# Patient Record
Sex: Male | Born: 1965 | Race: Black or African American | Hispanic: No | Marital: Single | Smoking: Never smoker
Health system: Southern US, Community
[De-identification: ages and names within clinical notes are randomized; demographics above are authoritative.]

## PROBLEM LIST (undated history)

## (undated) DIAGNOSIS — F32A Depression, unspecified: Secondary | ICD-10-CM

## (undated) DIAGNOSIS — F410 Panic disorder [episodic paroxysmal anxiety] without agoraphobia: Secondary | ICD-10-CM

## (undated) DIAGNOSIS — F329 Major depressive disorder, single episode, unspecified: Secondary | ICD-10-CM

## (undated) DIAGNOSIS — F209 Schizophrenia, unspecified: Secondary | ICD-10-CM

## (undated) DIAGNOSIS — F419 Anxiety disorder, unspecified: Secondary | ICD-10-CM

---

## 1998-12-11 ENCOUNTER — Emergency Department (HOSPITAL_COMMUNITY): Admission: EM | Admit: 1998-12-11 | Discharge: 1998-12-11 | Payer: Self-pay | Admitting: *Deleted

## 1998-12-11 ENCOUNTER — Encounter: Payer: Self-pay | Admitting: *Deleted

## 2007-02-26 ENCOUNTER — Ambulatory Visit: Payer: Self-pay | Admitting: Internal Medicine

## 2015-09-17 ENCOUNTER — Inpatient Hospital Stay (HOSPITAL_COMMUNITY)
Admission: EM | Admit: 2015-09-17 | Discharge: 2015-09-19 | DRG: 641 | Disposition: A | Discharge status: Home / Self Care | Payer: Self-pay | Attending: Internal Medicine | Admitting: Internal Medicine

## 2015-09-17 ENCOUNTER — Emergency Department (HOSPITAL_COMMUNITY): Payer: Self-pay

## 2015-09-17 ENCOUNTER — Encounter (HOSPITAL_COMMUNITY): Payer: Self-pay | Admitting: Emergency Medicine

## 2015-09-17 ENCOUNTER — Emergency Department (HOSPITAL_COMMUNITY): Admission: EM | Admit: 2015-09-17 | Discharge: 2015-09-17 | Payer: Self-pay

## 2015-09-17 DIAGNOSIS — Z79899 Other long term (current) drug therapy: Secondary | ICD-10-CM

## 2015-09-17 DIAGNOSIS — F329 Major depressive disorder, single episode, unspecified: Secondary | ICD-10-CM | POA: Diagnosis present

## 2015-09-17 DIAGNOSIS — R631 Polydipsia: Secondary | ICD-10-CM | POA: Diagnosis present

## 2015-09-17 DIAGNOSIS — E871 Hypo-osmolality and hyponatremia: Principal | ICD-10-CM

## 2015-09-17 DIAGNOSIS — F410 Panic disorder [episodic paroxysmal anxiety] without agoraphobia: Secondary | ICD-10-CM | POA: Diagnosis present

## 2015-09-17 DIAGNOSIS — R55 Syncope and collapse: Secondary | ICD-10-CM | POA: Diagnosis present

## 2015-09-17 DIAGNOSIS — R739 Hyperglycemia, unspecified: Secondary | ICD-10-CM | POA: Diagnosis present

## 2015-09-17 DIAGNOSIS — F411 Generalized anxiety disorder: Secondary | ICD-10-CM | POA: Diagnosis present

## 2015-09-17 DIAGNOSIS — F203 Undifferentiated schizophrenia: Secondary | ICD-10-CM | POA: Diagnosis present

## 2015-09-17 HISTORY — DX: Depression, unspecified: F32.A

## 2015-09-17 HISTORY — DX: Major depressive disorder, single episode, unspecified: F32.9

## 2015-09-17 HISTORY — DX: Panic disorder (episodic paroxysmal anxiety): F41.0

## 2015-09-17 HISTORY — DX: Anxiety disorder, unspecified: F41.9

## 2015-09-17 LAB — URINALYSIS, ROUTINE W REFLEX MICROSCOPIC
Bilirubin Urine: NEGATIVE
Glucose, UA: 100 mg/dL — AB
Ketones, ur: 15 mg/dL — AB
LEUKOCYTES UA: NEGATIVE
NITRITE: NEGATIVE
PROTEIN: 30 mg/dL — AB
SPECIFIC GRAVITY, URINE: 1.008 (ref 1.005–1.030)
UROBILINOGEN UA: 0.2 mg/dL (ref 0.0–1.0)
pH: 5.5 (ref 5.0–8.0)

## 2015-09-17 LAB — I-STAT VENOUS BLOOD GAS, ED
ACID-BASE DEFICIT: 11 mmol/L — AB (ref 0.0–2.0)
Acid-base deficit: 7 mmol/L — ABNORMAL HIGH (ref 0.0–2.0)
BICARBONATE: 17.3 meq/L — AB (ref 20.0–24.0)
Bicarbonate: 14.9 mEq/L — ABNORMAL LOW (ref 20.0–24.0)
O2 SAT: 98 %
O2 Saturation: 74 %
PCO2 VEN: 32.5 mmHg — AB (ref 45.0–50.0)
PCO2 VEN: 32.5 mmHg — AB (ref 45.0–50.0)
PH VEN: 7.333 — AB (ref 7.250–7.300)
TCO2: 16 mmol/L (ref 0–100)
TCO2: 18 mmol/L (ref 0–100)
pH, Ven: 7.269 (ref 7.250–7.300)
pO2, Ven: 119 mmHg — ABNORMAL HIGH (ref 30.0–45.0)
pO2, Ven: 42 mmHg (ref 30.0–45.0)

## 2015-09-17 LAB — BASIC METABOLIC PANEL
ANION GAP: 20 — AB (ref 5–15)
BUN: <5 mg/dL — ABNORMAL LOW (ref 6–20)
CALCIUM: 9.5 mg/dL (ref 8.9–10.3)
CO2: 15 mmol/L — ABNORMAL LOW (ref 22–32)
Chloride: 84 mmol/L — ABNORMAL LOW (ref 101–111)
Creatinine, Ser: 1.34 mg/dL — ABNORMAL HIGH (ref 0.61–1.24)
Glucose, Bld: 227 mg/dL — ABNORMAL HIGH (ref 65–99)
POTASSIUM: 3.7 mmol/L (ref 3.5–5.1)
Sodium: 119 mmol/L — CL (ref 135–145)

## 2015-09-17 LAB — CBC
HEMATOCRIT: 40.3 % (ref 39.0–52.0)
HEMOGLOBIN: 14.3 g/dL (ref 13.0–17.0)
MCH: 30.2 pg (ref 26.0–34.0)
MCHC: 35.5 g/dL (ref 30.0–36.0)
MCV: 85.2 fL (ref 78.0–100.0)
Platelets: 254 10*3/uL (ref 150–400)
RBC: 4.73 MIL/uL (ref 4.22–5.81)
RDW: 12.4 % (ref 11.5–15.5)
WBC: 8.9 10*3/uL (ref 4.0–10.5)

## 2015-09-17 LAB — HEPATIC FUNCTION PANEL
ALK PHOS: 67 U/L (ref 38–126)
ALT: 18 U/L (ref 17–63)
AST: 42 U/L — ABNORMAL HIGH (ref 15–41)
Albumin: 4.6 g/dL (ref 3.5–5.0)
BILIRUBIN INDIRECT: 0.7 mg/dL (ref 0.3–0.9)
Bilirubin, Direct: 0.2 mg/dL (ref 0.1–0.5)
TOTAL PROTEIN: 8 g/dL (ref 6.5–8.1)
Total Bilirubin: 0.9 mg/dL (ref 0.3–1.2)

## 2015-09-17 LAB — I-STAT TROPONIN, ED: TROPONIN I, POC: 0.01 ng/mL (ref 0.00–0.08)

## 2015-09-17 LAB — CREATININE, URINE, RANDOM: Creatinine, Urine: 49.84 mg/dL

## 2015-09-17 LAB — RAPID URINE DRUG SCREEN, HOSP PERFORMED
Amphetamines: NOT DETECTED
BARBITURATES: NOT DETECTED
Benzodiazepines: NOT DETECTED
COCAINE: NOT DETECTED
Opiates: NOT DETECTED
Tetrahydrocannabinol: NOT DETECTED

## 2015-09-17 LAB — NA AND K (SODIUM & POTASSIUM), RAND UR
POTASSIUM UR: 11 mmol/L
SODIUM UR: 47 mmol/L

## 2015-09-17 LAB — SALICYLATE LEVEL: Salicylate Lvl: <4 mg/dL (ref 2.8–30.0)

## 2015-09-17 LAB — URINE MICROSCOPIC-ADD ON

## 2015-09-17 LAB — CBG MONITORING, ED: GLUCOSE-CAPILLARY: 178 mg/dL — AB (ref 65–99)

## 2015-09-17 LAB — ETHANOL: Alcohol, Ethyl (B): <5 mg/dL (ref <5)

## 2015-09-17 LAB — LIPASE, BLOOD: LIPASE: 31 U/L (ref 22–51)

## 2015-09-17 LAB — ACETAMINOPHEN LEVEL: Acetaminophen (Tylenol), Serum: <10 ug/mL — ABNORMAL LOW (ref 10–30)

## 2015-09-17 LAB — I-STAT CG4 LACTIC ACID, ED: LACTIC ACID, VENOUS: 3.93 mmol/L — AB (ref 0.5–2.0)

## 2015-09-17 LAB — BETA-HYDROXYBUTYRIC ACID: BETA-HYDROXYBUTYRIC ACID: 0.35 mmol/L — AB (ref 0.05–0.27)

## 2015-09-17 MED ORDER — SODIUM CHLORIDE 0.9 % IV BOLUS (SEPSIS)
1000.0000 mL | Freq: Once | INTRAVENOUS | Status: AC
Start: 2015-09-17 — End: 2015-09-17
  Administered 2015-09-17: 1000 mL via INTRAVENOUS

## 2015-09-17 MED ORDER — LORAZEPAM 1 MG PO TABS
0.5000 mg | ORAL_TABLET | Freq: Once | ORAL | Status: AC
  Administered 2015-09-17: 0.5 mg via ORAL
  Filled 2015-09-17: qty 1

## 2015-09-17 MED ORDER — SODIUM CHLORIDE 0.9 % IV BOLUS (SEPSIS)
1000.0000 mL | Freq: Once | INTRAVENOUS | Status: AC
  Administered 2015-09-17: 1000 mL via INTRAVENOUS

## 2015-09-17 NOTE — ED Provider Notes (Signed)
CSN: [id number]     Arrival date & time 09/17/15  1917 History   First MD Initiated Contact with Patient 09/17/15 1941     Chief Complaint  Patient presents with  . Loss of Consciousness  . Anxiety     (Consider location/radiation/quality/duration/timing/severity/associated sxs/prior Treatment) HPI   Patient is a 49 year old male with history of anxiety and panic attacks. He is on Haldol extended release month monthly. He presents today because he was having an anxiety attack. He states that he felt like he couldn't talk at all. Patient's mom gave him a hug and let him to her bedroom and then he lowered himself down and refused to talk.  On arrival here he is tachycardic and respiratory rate is about 30. However he is lying comfortably with his eyes closed. When asked anxious he says yes. Patient has strange affect.    Past Medical History  Diagnosis Date  . Anxiety   . Panic attacks   . Depression    History reviewed. No pertinent past surgical history. History reviewed. No pertinent family history. Social History  Substance Use Topics  . Smoking status: Never Smoker   . Smokeless tobacco: None  . Alcohol Use: No    Review of Systems  Constitutional: Negative for fever and activity change.  HENT: Negative for drooling and hearing loss.   Eyes: Negative for discharge and redness.  Respiratory: Positive for shortness of breath. Negative for cough.   Cardiovascular: Negative for chest pain.  Gastrointestinal: Negative for abdominal pain.  Genitourinary: Negative for dysuria and urgency.  Musculoskeletal: Negative for arthralgias.  Allergic/Immunologic: Negative for immunocompromised state.  Neurological: Negative for seizures and speech difficulty.  Psychiatric/Behavioral: Positive for behavioral problems. Negative for agitation. The patient is nervous/anxious.   All other systems reviewed and are negative.     Allergies  Review of patient's allergies indicates no  known allergies.  Home Medications   Prior to Admission medications   Not on File   BP 160/77 mmHg  Pulse 118  Temp(Src) 99.1 F (37.3 C) (Oral)  Resp 32  Ht  (1.676 m)  Wt 191 lb (86.637 kg)  BMI 30.84 kg/m2  SpO2 98% Physical Exam  Constitutional: He is oriented to person, place, and time. He appears well-nourished.  Lying comfortably following commands and discussing things but with his eyes closed. Patient is very tachypnea.  HENT:  Head: Normocephalic.  Mouth/Throat: Oropharynx is clear and moist.  Eyes: Conjunctivae are normal.  Neck: No tracheal deviation present.  Cardiovascular:  Tachycardic  Pulmonary/Chest: Effort normal. No stridor. No respiratory distress. He has no wheezes. He has no rales.  Abdominal: Soft. There is no tenderness. There is no guarding.  Musculoskeletal: Normal range of motion. He exhibits no edema.  Neurological: He is oriented to person, place, and time. No cranial nerve deficit.  Skin: Skin is warm and dry. No rash noted. He is not diaphoretic.  Psychiatric: He has a normal mood and affect. His behavior is normal.  Nursing note and vitals reviewed.   ED Course  Procedures (including critical care time) Labs Review Labs Reviewed  BASIC METABOLIC PANEL  CBC  URINALYSIS, ROUTINE W REFLEX MICROSCOPIC (NOT AT [facility])  HEPATIC FUNCTION PANEL  LIPASE, BLOOD  ETHANOL  URINE RAPID DRUG SCREEN, HOSP PERFORMED  ACETAMINOPHEN LEVEL  SALICYLATE LEVEL  I-STAT TROPOININ, ED  CBG MONITORING, ED    Imaging Review No results found. I have personally reviewed and evaluated these images and lab results as part of  my medical decision-making.   EKG Interpretation   Date/Time:  Friday September 17 2015 19:24:09 EDT Ventricular Rate:  125 PR Interval:  118 QRS Duration: 96 QT Interval:  445 QTC Calculation: 642 R Axis:   49 Text Interpretation:  Sinus tachycardia Probable left atrial enlargement  RSR' in V1 or V2, probably normal variant  Prolonged QT interval large Ts  Sinus tachycardia Confirmed by Kandis Mannan ([id number]) on 09/17/2015  7:31:01 PM      MDM   Final diagnoses:  None   patient is a 49 year old male who lives with his mother is in today with anxiety attack. Patient's mom says that he was feeling anxious and he admits that he was feeling anxious she gave him a hug and then he could not speak for a period of time. Patient is on long-acting Haldol home. We will give him a little bit of Ativan here. He denies any ingestions. However I'm concerned about aspirin or salicylate, we will get levels now. We will get baseline labs. Patient exhibited tachypnea and tachycardia out of proportion to anxiety that he appears to be displaying.  Adelma Bowdoin Randall An, MD 09/17/15 2010

## 2015-09-17 NOTE — ED Notes (Signed)
MD at bedside. 

## 2015-09-17 NOTE — ED Notes (Signed)
Patient comes from home following syncopal episode.  Patient's mother states she heard patient crying and having an anxiety attack and held patient up while he lost consciousness.  Patient states this has happened before - hx depression/crying episodes, panic attacks, anxiety - patient reports he was thinking about something and became upset and had a panic attack.  Upon EMS arrival, pt was diaphoretic, HR 131, BP 155/98, 98% RA, RR 20.  Patient denies hitting head - mother helped him to floor and patient reports he remembers everything that happened.  Patient A&O x 4, NAD at this time.

## 2015-09-18 DIAGNOSIS — F411 Generalized anxiety disorder: Secondary | ICD-10-CM | POA: Diagnosis present

## 2015-09-18 DIAGNOSIS — R55 Syncope and collapse: Secondary | ICD-10-CM | POA: Diagnosis present

## 2015-09-18 DIAGNOSIS — F203 Undifferentiated schizophrenia: Secondary | ICD-10-CM | POA: Diagnosis present

## 2015-09-18 DIAGNOSIS — R739 Hyperglycemia, unspecified: Secondary | ICD-10-CM | POA: Diagnosis present

## 2015-09-18 LAB — BASIC METABOLIC PANEL
Anion gap: 8 (ref 5–15)
CALCIUM: 8.8 mg/dL — AB (ref 8.9–10.3)
CO2: 19 mmol/L — ABNORMAL LOW (ref 22–32)
CREATININE: 0.99 mg/dL (ref 0.61–1.24)
Chloride: 101 mmol/L (ref 101–111)
GFR calc Af Amer: >60 mL/min (ref >60)
Glucose, Bld: 112 mg/dL — ABNORMAL HIGH (ref 65–99)
Potassium: 4.2 mmol/L (ref 3.5–5.1)
SODIUM: 128 mmol/L — AB (ref 135–145)

## 2015-09-18 LAB — CBC
HCT: 36.5 % — ABNORMAL LOW (ref 39.0–52.0)
Hemoglobin: 12.9 g/dL — ABNORMAL LOW (ref 13.0–17.0)
MCH: 30.1 pg (ref 26.0–34.0)
MCHC: 35.3 g/dL (ref 30.0–36.0)
MCV: 85.1 fL (ref 78.0–100.0)
PLATELETS: 231 10*3/uL (ref 150–400)
RBC: 4.29 MIL/uL (ref 4.22–5.81)
RDW: 13.1 % (ref 11.5–15.5)
WBC: 7.6 10*3/uL (ref 4.0–10.5)

## 2015-09-18 LAB — NA AND K (SODIUM & POTASSIUM), RAND UR
POTASSIUM UR: 11 mmol/L
SODIUM UR: 48 mmol/L

## 2015-09-18 LAB — OSMOLALITY, URINE: Osmolality, Ur: 217 mOsm/kg — ABNORMAL LOW (ref 390–1090)

## 2015-09-18 LAB — GLUCOSE, CAPILLARY
GLUCOSE-CAPILLARY: 101 mg/dL — AB (ref 65–99)
GLUCOSE-CAPILLARY: 98 mg/dL (ref 65–99)
Glucose-Capillary: 112 mg/dL — ABNORMAL HIGH (ref 65–99)
Glucose-Capillary: 105 mg/dL — ABNORMAL HIGH (ref 65–99)

## 2015-09-18 LAB — TSH: TSH: 0.548 u[IU]/mL (ref 0.350–4.500)

## 2015-09-18 MED ORDER — LORAZEPAM 2 MG/ML IJ SOLN
1.0000 mg | Freq: Once | INTRAMUSCULAR | Status: AC
  Administered 2015-09-18: 1 mg via INTRAVENOUS
  Filled 2015-09-18: qty 1

## 2015-09-18 MED ORDER — SODIUM CHLORIDE 0.9 % IJ SOLN: 3.0000 mL | INTRAMUSCULAR | Status: DC | PRN

## 2015-09-18 MED ORDER — ACETAMINOPHEN 650 MG RE SUPP: 650.0000 mg | Freq: Four times a day (QID) | RECTAL | Status: DC | PRN

## 2015-09-18 MED ORDER — BENZTROPINE MESYLATE 1 MG PO TABS
0.5000 mg | ORAL_TABLET | Freq: Two times a day (BID) | ORAL | Status: DC
  Administered 2015-09-18 – 2015-09-19 (×2): 0.5 mg via ORAL
  Filled 2015-09-18 (×2): qty 1

## 2015-09-18 MED ORDER — SODIUM CHLORIDE 0.9 % IJ SOLN
3.0000 mL | Freq: Two times a day (BID) | INTRAMUSCULAR | Status: DC
  Administered 2015-09-18 – 2015-09-19 (×4): 3 mL via INTRAVENOUS

## 2015-09-18 MED ORDER — ALUM & MAG HYDROXIDE-SIMETH 200-200-20 MG/5ML PO SUSP: 30.0000 mL | Freq: Four times a day (QID) | ORAL | Status: DC | PRN

## 2015-09-18 MED ORDER — INFLUENZA VAC SPLIT QUAD 0.5 ML IM SUSY
0.5000 mL | PREFILLED_SYRINGE | INTRAMUSCULAR | Status: AC
  Administered 2015-09-19: 0.5 mL via INTRAMUSCULAR
  Filled 2015-09-18: qty 0.5

## 2015-09-18 MED ORDER — BENZTROPINE MESYLATE 1 MG PO TABS: 0.5000 mg | ORAL_TABLET | Freq: Two times a day (BID) | ORAL | Status: DC

## 2015-09-18 MED ORDER — BENZTROPINE MESYLATE 1 MG PO TABS
1.0000 mg | ORAL_TABLET | Freq: Every day | ORAL | Status: DC
  Administered 2015-09-18: 1 mg via ORAL
  Filled 2015-09-18: qty 1

## 2015-09-18 MED ORDER — ACETAMINOPHEN 325 MG PO TABS: 650.0000 mg | ORAL_TABLET | Freq: Four times a day (QID) | ORAL | Status: DC | PRN

## 2015-09-18 MED ORDER — ONDANSETRON HCL 4 MG/2ML IJ SOLN: 4.0000 mg | Freq: Four times a day (QID) | INTRAMUSCULAR | Status: DC | PRN

## 2015-09-18 MED ORDER — ONDANSETRON HCL 4 MG PO TABS: 4.0000 mg | ORAL_TABLET | Freq: Four times a day (QID) | ORAL | Status: DC | PRN

## 2015-09-18 MED ORDER — CLONAZEPAM 0.5 MG PO TABS
0.5000 mg | ORAL_TABLET | Freq: Two times a day (BID) | ORAL | Status: DC | PRN
  Administered 2015-09-19: 0.5 mg via ORAL
  Filled 2015-09-18: qty 1

## 2015-09-18 MED ORDER — SODIUM CHLORIDE 0.9 % IV SOLN: 250.0000 mL | INTRAVENOUS | Status: DC | PRN

## 2015-09-18 MED ORDER — HYDROMORPHONE HCL 1 MG/ML IJ SOLN: 0.5000 mg | INTRAMUSCULAR | Status: DC | PRN

## 2015-09-18 MED ORDER — OXYCODONE HCL 5 MG PO TABS: 5.0000 mg | ORAL_TABLET | ORAL | Status: DC | PRN

## 2015-09-18 MED ORDER — INSULIN ASPART 100 UNIT/ML ~~LOC~~ SOLN: 0.0000 [IU] | SUBCUTANEOUS | Status: DC

## 2015-09-18 MED ORDER — LORAZEPAM 2 MG/ML IJ SOLN: 0.5000 mg | Freq: Four times a day (QID) | INTRAMUSCULAR | Status: DC | PRN

## 2015-09-18 MED ORDER — BENZTROPINE MESYLATE 1 MG PO TABS
0.5000 mg | ORAL_TABLET | Freq: Every day | ORAL | Status: DC
  Administered 2015-09-18: 5 mg via ORAL
  Filled 2015-09-18: qty 1

## 2015-09-18 MED ORDER — ENOXAPARIN SODIUM 40 MG/0.4ML ~~LOC~~ SOLN
40.0000 mg | Freq: Every day | SUBCUTANEOUS | Status: DC
  Administered 2015-09-18 – 2015-09-19 (×2): 40 mg via SUBCUTANEOUS
  Filled 2015-09-18 (×2): qty 0.4

## 2015-09-18 NOTE — Progress Notes (Signed)
Patient admitted after midnight. Reports hearing voices. Reports panic attacks. Denies suicidal or homicidal ideation. Calm and cooperative. No history of diabetes. Hemoglobin A1c pending. Urine osmolality higher than expected if polydipsia is the sole cause of his hyponatremia. Will also check a TSH. Sodium is improved today. Will consult psychiatry for medication management, given his anxiety/panic attacks and worsening of his auditory hallucinations.  Crista Curb, MD [facility] [email or web address] password [facility]

## 2015-09-18 NOTE — H&P (Signed)
[facility] Admission History and Physical       Adam English ZOX:[id number] DOB: 1966/10/11 DOA: 09/17/2015  Referring physician: EDP PCP: No PCP Per Patient  Specialists:   Chief Complaint:  Passed Out  HPI: Adam English is a 49 y.o. male with a history of Schizophrenia who presents to the ED afte passing out at home .   His mother reports that he was anxious and havign a panic attack and breathing rapidly and passed out.   She also reports that he was hearing voices.  He was evaluated in the ED and  Was found to have Hyponatremia of 119, and a glucose level of 227.  His BUN/Cr was <5. And a Cr of 1.34.   When asked about his flid consumption, his mother reports that he drinks continuously, water and soda all day.  He says that his mouth is dry.    He received his monthly Haldol injection 3 days ago.     Review of Systems:  Constitutional: No Weight Loss, No Weight Gain, Night Sweats, Fevers, Chills, Dizziness, Light Headedness, Fatigue, or Generalized Weakness HEENT: No Headaches, Difficulty Swallowing,Tooth/Dental Problems,Sore Throat,  No Sneezing, Rhinitis, Ear Ache, Nasal Congestion, or Post Nasal Drip,  Cardio-vascular:  No Chest pain, Orthopnea, PND, Edema in Lower Extremities, Anasarca, Dizziness, Palpitations  Resp: No Dyspnea, No DOE, No Productive Cough, No Non-Productive Cough, No Hemoptysis, No Wheezing.    GI: No Heartburn, Indigestion, Abdominal Pain, Nausea, Vomiting, Diarrhea, Constipation, Hematemesis, Hematochezia, Melena, Change in Bowel Habits,  Loss of Appetite  GU: No Dysuria, No Change in Color of Urine, No Urgency or Urinary Frequency, No Flank pain.  Musculoskeletal: No Joint Pain or Swelling, No Decreased Range of Motion, No Back Pain.  Neurologic:   +Syncope, No Seizures, Muscle Weakness, Paresthesia, Vision Disturbance or Loss, No Diplopia, No Vertigo, No Difficulty Walking,  Skin: No Rash or Lesions. Psych: No Change in Mood or Affect, No Depression  or Anxiety, No Memory loss, No Confusion, or Hallucinations   Past Medical History  Diagnosis Date  . Anxiety   . Panic attacks   . Depression      History reviewed. No pertinent past surgical history.    Prior to Admission medications   Medication Sig Start Date End Date Taking? Authorizing Provider  benztropine (COGENTIN) 1 MG tablet Take 0.5-1 mg by mouth 2 (two) times daily. Takes 0.5mg  in am and  in pm   Yes Historical Provider, MD  haloperidol decanoate (HALDOL DECANOATE) 50 MG/ML injection Inject 100 mg into the muscle every 28 (twenty-eight) days.   Yes Historical Provider, MD     No Known Allergies  Social History:  reports that he has never smoked. He does not have any smokeless tobacco history on file. He reports that he does not drink alcohol or use illicit drugs.    History reviewed. No pertinent family history.     Physical Exam:  GEN:  Pleasant Well Nourished and Well Developed  49 y.o. African American  male examined and in no acute distress; cooperative with exam Filed Vitals:   09/17/15 2215 09/17/15 2230 09/17/15 2245 09/17/15 2300  BP: 163/88 144/87 159/97 162/83  Pulse: 107 109 111 109  Temp:      TempSrc:      Resp: 34 39 36 29  Height:      Weight:      SpO2: 98% 98% 99% 100%   Blood pressure 162/83, pulse 109, temperature 99.1 F (37.3 C), temperature source Oral,  resp. rate 29, height  (1.676 m), weight 86.637 kg (191 lb), SpO2 100 %. PSYCH: He is alert and oriented x4; does not appear anxious does not appear depressed; affect is normal HEENT: Normocephalic and Atraumatic, Mucous membranes pink; PERRLA; EOM intact; Fundi:  Benign;  No scleral icterus, Nares: Patent, Oropharynx: Clear, Fair Dentition,    Neck:  FROM, No Cervical Lymphadenopathy nor Thyromegaly or Carotid Bruit; No JVD; Breasts:: Not examined CHEST WALL: No tenderness CHEST: Normal respiration, clear to auscultation bilaterally HEART: Regular rate and rhythm; no murmurs  rubs or gallops BACK: No kyphosis or scoliosis; No CVA tenderness ABDOMEN: Positive Bowel Sounds,  Soft Non-Tender, No Rebound or Guarding; No Masses, No Organomegalya. Rectal Exam: Not done EXTREMITIES: No Cyanosis, Clubbing, or Edema; No Ulcerations. Genitalia: not examined PULSES: 2+ and symmetric SKIN: Normal hydration no rash or ulceration CNS:  Alert and Oriented x 4, No Focal Deficits Vascular: pulses palpable throughout    Labs on Admission:  Basic Metabolic Panel:  Recent Labs Lab 09/17/15 1956  NA 119*  K 3.7  CL 84*  CO2 15*  GLUCOSE 227*  BUN <5*  CREATININE 1.34*  CALCIUM 9.5   Liver Function Tests:  Recent Labs Lab 09/17/15 1956  AST 42*  ALT 18  ALKPHOS 67  BILITOT 0.9  PROT 8.0  ALBUMIN 4.6    Recent Labs Lab 09/17/15 1956  LIPASE 31   No results for input(s): AMMONIA in the last 168 hours. CBC:  Recent Labs Lab 09/17/15 1956  WBC 8.9  HGB 14.3  HCT 40.3  MCV 85.2  PLT 254   Cardiac Enzymes: No results for input(s): CKTOTAL, CKMB, CKMBINDEX, TROPONINI in the last 168 hours.  BNP (last 3 results) No results for input(s): BNP in the last 8760 hours.  ProBNP (last 3 results) No results for input(s): PROBNP in the last 8760 hours.  CBG:  Recent Labs Lab 09/17/15 2015  GLUCAP 178*    Radiological Exams on Admission: Dg Chest Portable 1 View  09/17/2015   CLINICAL DATA:  Elevated lactate.  Initial encounter.  EXAM: PORTABLE CHEST 1 VIEW  COMPARISON:  None.  FINDINGS: The lungs are well-aerated and clear. There is no evidence of focal opacification, pleural effusion or pneumothorax.  The cardiomediastinal silhouette is within normal limits. No acute osseous abnormalities are seen.  IMPRESSION: No acute cardiopulmonary process seen.   Electronically Signed   By: Roanna Raider M.D.   On: 09/17/2015 23:19     EKG: Independently reviewed.    Assessment/Plan:   49 y.o. male with    Active Problems:   1.     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     Syncope- due to Hyperventilation   Cardiac Monitoring   PRN IV Ativan     3.     Anxiety   PRN IV Ativan   Consult Psych in AM     4.     Hyperglycemia- New Diabetes   SSI coverage PRN   Check HbA1C     5.     Schizophrenia   On Monthly Haldol Injections    With daily Benztropine Rx   Consult Psych in AM    6.     DVT Prophylaxis   Lovenox   Code Status:     FULL CODE Family Communication:   Mother at Bedside  Disposition Plan:    Inpatient  Status        Time spent:  46 Minutes      Ron Parker [facility] Pager [phone]   If 7AM -7PM Please Contact the Day Rounding Team MD for [facility]  If 7PM-7AM, Please Contact Night-Floor Coverage  [email or web address] Password [email or web address] 09/18/2015, 12:15 AM     ADDENDUM:   Patient was seen and examined on 09/18/2015

## 2015-09-18 NOTE — Consult Note (Signed)
[facility] Consult   Reason for Consult:  Schizophrenia, anxiety Referring Physician:  Dr. Conley Canal Patient Identification: Adam English MRN:  [id number] Principal Diagnosis: <principal problem not specified> Diagnosis:   Patient Active Problem List   Diagnosis Date Noted  . Syncope [R55] 09/18/2015  . Anxiety state [F41.1] 09/18/2015  . Hyperglycemia [R73.9] 09/18/2015  . Undifferentiated schizophrenia ([place]) [F20.3] 09/18/2015  . Hyponatremia [E87.1] 09/17/2015    Total Time spent with patient: 45 minutes  Subjective:   Adam English is a 49 y.o. male patient admitted with hyponatremia, passing out spell.  HPI:  This patient is a 49 year old black male who lives with his mother in [place]. He is currently unemployed. He has a 13 year history of schizophrenia and is followed at [facility]. He receives monthly Haldol injections and takes benztropine daily. He states his mother has been extremely dry and he is constantly drinking water. On admission his sodium was 119.  The day prior to admission he and his mother were trying to find a physician into a physical for him. 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 When they were not able to find one and he became very panicked hyperventilated and passed out. His sodium is now normalizing and he has not drinking as much. He denies being depressed or suicidal or homicidal. He states that in the past used to hear voices and see things but he hasn't had this in a number of years. He is very compliant with his shots and just got one 3 days ago. He does state he gets anxious at times but doesn't particularly want medication for this. He states that he would feel better if his benztropine dose was lowered due to the dry mouth  Past Psychiatric History: 13 year history of schizophrenia, followed at [facility] to Self: Is patient at risk for suicide?: No Risk to Others:   Prior Inpatient Therapy:   Prior  Outpatient Therapy:    Past Medical History:  Past Medical History  Diagnosis Date  . Anxiety   . Panic attacks   . Depression    History reviewed. No pertinent past surgical history. Family History: History reviewed. No pertinent family history. Family Psychiatric  History: none Social History:  History  Alcohol Use No     History  Drug Use No    Social History   Social History  . Marital Status: Single    Spouse Name: N/A  . Number of Children: N/A  . Years of Education: N/A   Social History Main Topics  . Smoking status: Never Smoker   . Smokeless tobacco: None  . Alcohol Use: No  . Drug Use: No  . Sexual Activity: Not Asked   Other Topics Concern  . None   Social History Narrative  . None   Additional Social History:                          Allergies:  No Known Allergies  Labs:  Results for orders placed or performed during the hospital encounter of 09/17/15 (from the past 48 hour(s))  Osmolality, urine     Status: Abnormal   Collection Time: 09/17/15  3:35 AM  Result Value Ref Range   Osmolality, Ur 217 (L) 390 - 1090 mOsm/kg    Comment: Performed at [facility]  Na and K (sodium & potassium), rand urine     Status: None   Collection Time:  09/17/15  3:36 AM  Result Value Ref Range   Sodium, Ur 48 mmol/L   Potassium Urine Timed 11 mmol/L  Basic metabolic panel     Status: Abnormal   Collection Time: 09/17/15  7:56 PM  Result Value Ref Range   Sodium 119 (LL) 135 - 145 mmol/L    Comment: REPEATED TO VERIFY CRITICAL RESULT CALLED TO, READ BACK BY AND VERIFIED WITH: B BURGRESS,RN 2049 09/17/2015 WBOND    Potassium 3.7 3.5 - 5.1 mmol/L   Chloride 84 (L) 101 - 111 mmol/L   CO2 15 (L) 22 - 32 mmol/L   Glucose, Bld 227 (H) 65 - 99 mg/dL   BUN <5 (L) 6 - 20 mg/dL   Creatinine, Ser 7.84 (H) 0.61 - 1.24 mg/dL   Calcium 9.5 8.9 - 12.8 mg/dL   GFR calc non Af Amer >60 >60 mL/min   GFR calc Af Amer >60 >60 mL/min    Comment:  (NOTE) The eGFR has been calculated using the CKD EPI equation. This calculation has not been validated in all clinical situations. eGFR's persistently <60 mL/min signify possible Chronic Kidney Disease.    Anion gap 20 (H) 5 - 15  CBC     Status: None   Collection Time: 09/17/15  7:56 PM  Result Value Ref Range   WBC 8.9 4.0 - 10.5 K/uL   RBC 4.73 4.22 - 5.81 MIL/uL   Hemoglobin 14.3 13.0 - 17.0 g/dL   HCT 20.8 13.8 - 87.1 %   MCV 85.2 78.0 - 100.0 fL   MCH 30.2 26.0 - 34.0 pg   MCHC 35.5 30.0 - 36.0 g/dL   RDW 95.9 74.7 - 18.5 %   Platelets 254 150 - 400 K/uL  Hepatic function panel     Status: Abnormal   Collection Time: 09/17/15  7:56 PM  Result Value Ref Range   Total Protein 8.0 6.5 - 8.1 g/dL   Albumin 4.6 3.5 - 5.0 g/dL   AST 42 (H) 15 - 41 U/L   ALT 18 17 - 63 U/L   Alkaline Phosphatase 67 38 - 126 U/L   Total Bilirubin 0.9 0.3 - 1.2 mg/dL   Bilirubin, Direct 0.2 0.1 - 0.5 mg/dL   Indirect Bilirubin 0.7 0.3 - 0.9 mg/dL  Lipase, blood     Status: None   Collection Time: 09/17/15  7:56 PM  Result Value Ref Range   Lipase 31 22 - 51 U/L  I-stat troponin, ED     Status: None   Collection Time: 09/17/15  8:08 PM  Result Value Ref Range   Troponin i, poc 0.01 0.00 - 0.08 ng/mL   Comment 3            Comment: Due to the release kinetics of cTnI, a negative result within the first hours of the onset of symptoms does not rule out myocardial infarction with certainty. If myocardial infarction is still suspected, repeat the test at appropriate intervals.   CBG monitoring, ED     Status: Abnormal   Collection Time: 09/17/15  8:15 PM  Result Value Ref Range   Glucose-Capillary 178 (H) 65 - 99 mg/dL  Ethanol     Status: None   Collection Time: 09/17/15  8:20 PM  Result Value Ref Range   Alcohol, Ethyl (B) <5 <5 mg/dL    Comment:        LOWEST DETECTABLE LIMIT FOR SERUM ALCOHOL IS 5 mg/dL FOR MEDICAL PURPOSES ONLY   Acetaminophen level  Status: Abnormal    Collection Time: 09/17/15  8:20 PM  Result Value Ref Range   Acetaminophen (Tylenol), Serum <10 (L) 10 - 30 ug/mL    Comment:        THERAPEUTIC CONCENTRATIONS VARY SIGNIFICANTLY. A RANGE OF 10-30 ug/mL MAY BE AN EFFECTIVE CONCENTRATION FOR MANY PATIENTS. HOWEVER, SOME ARE BEST TREATED AT CONCENTRATIONS OUTSIDE THIS RANGE. ACETAMINOPHEN CONCENTRATIONS >150 ug/mL AT 4 HOURS AFTER INGESTION AND >50 ug/mL AT 12 HOURS AFTER INGESTION ARE OFTEN ASSOCIATED WITH TOXIC REACTIONS.   Salicylate level     Status: None   Collection Time: 09/17/15  8:20 PM  Result Value Ref Range   Salicylate Lvl <4.3 2.8 - 30.0 mg/dL  I-Stat venous blood gas, ED     Status: Abnormal   Collection Time: 09/17/15  8:56 PM  Result Value Ref Range   pH, Ven 7.269 7.250 - 7.300   pCO2, Ven 32.5 (L) 45.0 - 50.0 mmHg   pO2, Ven 119.0 (H) 30.0 - 45.0 mmHg   Bicarbonate 14.9 (L) 20.0 - 24.0 mEq/L   TCO2 16 0 - 100 mmol/L   O2 Saturation 98.0 %   Acid-base deficit 11.0 (H) 0.0 - 2.0 mmol/L   Patient temperature HIDE    Sample type VENOUS   Urinalysis, Routine w reflex microscopic (not at [facility])     Status: Abnormal   Collection Time: 09/17/15  9:00 PM  Result Value Ref Range   Color, Urine YELLOW YELLOW   APPearance CLOUDY (A) CLEAR   Specific Gravity, Urine 1.008 1.005 - 1.030   pH 5.5 5.0 - 8.0   Glucose, UA 100 (A) NEGATIVE mg/dL   Hgb urine dipstick MODERATE (A) NEGATIVE   Bilirubin Urine NEGATIVE NEGATIVE   Ketones, ur 15 (A) NEGATIVE mg/dL   Protein, ur 30 (A) NEGATIVE mg/dL   Urobilinogen, UA 0.2 0.0 - 1.0 mg/dL   Nitrite NEGATIVE NEGATIVE   Leukocytes, UA NEGATIVE NEGATIVE  Urine microscopic-add on     Status: None   Collection Time: 09/17/15  9:00 PM  Result Value Ref Range   Squamous Epithelial / LPF RARE RARE   WBC, UA 0-2 <3 WBC/hpf   RBC / HPF 0-2 <3 RBC/hpf  Urine rapid drug screen (hosp performed)     Status: None   Collection Time: 09/17/15  9:01 PM  Result Value Ref Range   Opiates  NONE DETECTED NONE DETECTED   Cocaine NONE DETECTED NONE DETECTED   Benzodiazepines NONE DETECTED NONE DETECTED   Amphetamines NONE DETECTED NONE DETECTED   Tetrahydrocannabinol NONE DETECTED NONE DETECTED   Barbiturates NONE DETECTED NONE DETECTED    Comment:        DRUG SCREEN FOR MEDICAL PURPOSES ONLY.  IF CONFIRMATION IS NEEDED FOR ANY PURPOSE, NOTIFY LAB WITHIN 5 DAYS.        LOWEST DETECTABLE LIMITS FOR URINE DRUG SCREEN Drug Class       Cutoff (ng/mL) Amphetamine      1000 Barbiturate      200 Benzodiazepine   154 Tricyclics       008 Opiates          300 Cocaine          300 THC              50   Creatinine, urine, random     Status: None   Collection Time: 09/17/15  9:01 PM  Result Value Ref Range   Creatinine, Urine 49.84 mg/dL  Na and K (sodium &  potassium), rand urine     Status: None   Collection Time: 09/17/15  9:01 PM  Result Value Ref Range   Sodium, Ur 47 mmol/L   Potassium Urine Timed 11 mmol/L  Ethanol     Status: None   Collection Time: 09/17/15 10:16 PM  Result Value Ref Range   Alcohol, Ethyl (B) <5 <5 mg/dL    Comment:        LOWEST DETECTABLE LIMIT FOR SERUM ALCOHOL IS 5 mg/dL FOR MEDICAL PURPOSES ONLY   Beta-hydroxybutyric acid     Status: Abnormal   Collection Time: 09/17/15 10:16 PM  Result Value Ref Range   Beta-Hydroxybutyric Acid 0.35 (H) 0.05 - 0.27 mmol/L  I-Stat venous blood gas, ED     Status: Abnormal   Collection Time: 09/17/15 10:20 PM  Result Value Ref Range   pH, Ven 7.333 (H) 7.250 - 7.300   pCO2, Ven 32.5 (L) 45.0 - 50.0 mmHg   pO2, Ven 42.0 30.0 - 45.0 mmHg   Bicarbonate 17.3 (L) 20.0 - 24.0 mEq/L   TCO2 18 0 - 100 mmol/L   O2 Saturation 74.0 %   Acid-base deficit 7.0 (H) 0.0 - 2.0 mmol/L   Patient temperature HIDE    Sample type VENOUS   I-Stat CG4 Lactic Acid, ED     Status: Abnormal   Collection Time: 09/17/15 10:24 PM  Result Value Ref Range   Lactic Acid, Venous 3.93 (HH) 0.5 - 2.0 mmol/L   Comment NOTIFIED  PHYSICIAN   Basic metabolic panel     Status: Abnormal   Collection Time: 09/18/15  6:30 AM  Result Value Ref Range   Sodium 128 (L) 135 - 145 mmol/L   Potassium 4.2 3.5 - 5.1 mmol/L   Chloride 101 101 - 111 mmol/L   CO2 19 (L) 22 - 32 mmol/L   Glucose, Bld 112 (H) 65 - 99 mg/dL   BUN <5 (L) 6 - 20 mg/dL   Creatinine, Ser 0.99 0.61 - 1.24 mg/dL   Calcium 8.8 (L) 8.9 - 10.3 mg/dL   GFR calc non Af Amer >60 >60 mL/min   GFR calc Af Amer >60 >60 mL/min    Comment: (NOTE) The eGFR has been calculated using the CKD EPI equation. This calculation has not been validated in all clinical situations. eGFR's persistently <60 mL/min signify possible Chronic Kidney Disease.    Anion gap 8 5 - 15  CBC     Status: Abnormal   Collection Time: 09/18/15  6:30 AM  Result Value Ref Range   WBC 7.6 4.0 - 10.5 K/uL   RBC 4.29 4.22 - 5.81 MIL/uL   Hemoglobin 12.9 (L) 13.0 - 17.0 g/dL   HCT 36.5 (L) 39.0 - 52.0 %   MCV 85.1 78.0 - 100.0 fL   MCH 30.1 26.0 - 34.0 pg   MCHC 35.3 30.0 - 36.0 g/dL   RDW 13.1 11.5 - 15.5 %   Platelets 231 150 - 400 K/uL  Glucose, capillary     Status: Abnormal   Collection Time: 09/18/15  7:42 AM  Result Value Ref Range   Glucose-Capillary 112 (H) 65 - 99 mg/dL  Glucose, capillary     Status: Abnormal   Collection Time: 09/18/15 11:47 AM  Result Value Ref Range   Glucose-Capillary 101 (H) 65 - 99 mg/dL    Current Facility-Administered Medications  Medication Dose Route Frequency Provider Last Rate Last Dose  . 0.9 %  sodium chloride infusion  250 mL Intravenous PRN Harvette  Evonnie Dawes, MD      . acetaminophen (TYLENOL) tablet 650 mg  650 mg Oral Q6H PRN Theressa Millard, MD       Or  . acetaminophen (TYLENOL) suppository 650 mg  650 mg Rectal Q6H PRN Theressa Millard, MD      . alum & mag hydroxide-simeth (MAALOX/MYLANTA) 200-200-20 MG/5ML suspension 30 mL  30 mL Oral Q6H PRN Theressa Millard, MD      . benztropine (COGENTIN) tablet 0.5 mg  0.5 mg Oral  Daily Theressa Millard, MD   5 mg at 09/18/15 0901   And  . benztropine (COGENTIN) tablet 1 mg  1 mg Oral QHS Theressa Millard, MD   1 mg at 09/18/15 0125  . enoxaparin (LOVENOX) injection 40 mg  40 mg Subcutaneous Daily Theressa Millard, MD   40 mg at 09/18/15 0459  . LORazepam (ATIVAN) injection 0.5 mg  0.5 mg Intravenous Q6H PRN Theressa Millard, MD      . ondansetron (ZOFRAN) tablet 4 mg  4 mg Oral Q6H PRN Theressa Millard, MD       Or  . ondansetron (ZOFRAN) injection 4 mg  4 mg Intravenous Q6H PRN Harvette Evonnie Dawes, MD      . sodium chloride 0.9 % injection 3 mL  3 mL Intravenous Q12H Theressa Millard, MD   3 mL at 09/18/15 9774  . sodium chloride 0.9 % injection 3 mL  3 mL Intravenous PRN Theressa Millard, MD        Musculoskeletal: Strength & Muscle Tone: within normal limits Gait & Station: normal Patient leans: N/A  Psychiatric Specialty Exam: ROS  Blood pressure 133/86, pulse 105, temperature 98.4 F (36.9 C), temperature source Oral, resp. rate 19, height $RemoveBe'5\' 6"'[id number]$  (1.676 m), weight 83.915 kg (185 lb), SpO2 99 %.Body mass index is 29.87 kg/(m^2).  General Appearance: Casual and Fairly Groomed  Engineer, water::  Fair  Speech:  Slow  Volume:  Normal  Mood:  Anxious  Affect:  Blunt and Flat  Thought Process:  Goal Directed  Orientation:  Full (Time, Place, and Person)  Thought Content:  Rumination  Suicidal Thoughts:  No  Homicidal Thoughts:  No  Memory:  Immediate;   Good Recent;   Fair Remote;   Fair  Judgement:  Fair  Insight:  Fair  Psychomotor Activity:  Normal  Concentration:  Fair  Recall:  [employer] of Knowledge:Fair  Language: Fair  Akathisia:  No  Handed:  Right  AIMS (if indicated):     Assets:  Communication Skills Desire for Improvement Resilience Social Support  ADL's:  Intact  Cognition: WNL  Sleep:      Treatment Plan Summary: Medication management  Disposition: No evidence of imminent risk to self or others at present.    Patient does not meet criteria for psychiatric inpatient admission. Discussed crisis plan, support from social network, calling 911, coming to the Emergency Department, and calling Suicide Hotline. Patient does not require inpatient psychiatric care. However I will reduce his benztropine dose a little bit help with the dry mouth. A low dose of clonazepam can be made available in case he gets anxious again. Dawson Albers, [facility] 09/18/2015 1:53 PM

## 2015-09-19 DIAGNOSIS — R739 Hyperglycemia, unspecified: Secondary | ICD-10-CM

## 2015-09-19 DIAGNOSIS — F203 Undifferentiated schizophrenia: Secondary | ICD-10-CM

## 2015-09-19 DIAGNOSIS — E871 Hypo-osmolality and hyponatremia: Principal | ICD-10-CM

## 2015-09-19 DIAGNOSIS — F411 Generalized anxiety disorder: Secondary | ICD-10-CM

## 2015-09-19 DIAGNOSIS — R55 Syncope and collapse: Secondary | ICD-10-CM

## 2015-09-19 LAB — BASIC METABOLIC PANEL
Anion gap: 8 (ref 5–15)
BUN: 7 mg/dL (ref 6–20)
CHLORIDE: 98 mmol/L — AB (ref 101–111)
CO2: 23 mmol/L (ref 22–32)
Calcium: 9.4 mg/dL (ref 8.9–10.3)
Creatinine, Ser: 1 mg/dL (ref 0.61–1.24)
GFR calc Af Amer: >60 mL/min (ref >60)
GFR calc non Af Amer: >60 mL/min (ref >60)
GLUCOSE: 94 mg/dL (ref 65–99)
POTASSIUM: 4.1 mmol/L (ref 3.5–5.1)
Sodium: 129 mmol/L — ABNORMAL LOW (ref 135–145)

## 2015-09-19 LAB — GLUCOSE, CAPILLARY: GLUCOSE-CAPILLARY: 90 mg/dL (ref 65–99)

## 2015-09-19 MED ORDER — BENZTROPINE MESYLATE 0.5 MG PO TABS: 0.5000 mg | ORAL_TABLET | Freq: Two times a day (BID) | ORAL | Status: DC

## 2015-09-19 MED ORDER — CLONAZEPAM 0.5 MG PO TABS: 0.5000 mg | ORAL_TABLET | Freq: Two times a day (BID) | ORAL | Status: DC | PRN

## 2015-09-19 NOTE — Progress Notes (Signed)
Nsg Discharge Note  Admit Date:  09/17/2015 Discharge date: 09/19/2015   Adam English to be D/C'd Home per MD order.  AVS completed.  Copy for chart, and copy for patient signed, and dated. Patient/caregiver able to verbalize understanding.  Discharge Medication:   Medication List    TAKE these medications        benztropine 0.5 MG tablet  Commonly known as:  COGENTIN  Take 1 tablet (0.5 mg total) by mouth 2 (two) times daily.     clonazePAM 0.5 MG tablet  Commonly known as:  KLONOPIN  Take 1 tablet (0.5 mg total) by mouth 2 (two) times daily as needed (anxiety).     haloperidol decanoate 50 MG/ML injection  Commonly known as:  HALDOL DECANOATE  Inject 100 mg into the muscle every 28 (twenty-eight) days.        Discharge Assessment: Filed Vitals:   09/19/15 0506  BP: 137/87  Pulse: 68  Temp: 98.7 F (37.1 C)  Resp: 17   Skin clean, dry and intact without evidence of skin break down, no evidence of skin tears noted. IV catheter discontinued intact. Site without signs and symptoms of complications - no redness or edema noted at insertion site, patient denies c/o pain - only slight tenderness at site.  Dressing with slight pressure applied.  D/c Instructions-Education: Discharge instructions given to patient/family with verbalized understanding. D/c education completed with patient/family including follow up instructions, medication list, d/c activities limitations if indicated, with other d/c instructions as indicated by MD - patient able to verbalize understanding, all questions fully answered. Patient instructed to return to ED, call 911, or call MD for any changes in condition.  Patient escorted via WC, and D/C home via private auto.  Camillo Flaming, RN 09/19/2015 10:39 AM

## 2015-09-19 NOTE — Discharge Summary (Signed)
Physician Discharge Summary  Adam English ZOX:[id number] DOB: 1966/03/26 DOA: 09/17/2015  PCP: No PCP Per Patient  Admit date: 09/17/2015 Discharge date: 09/19/2015  Time spent: < 30 minutes  Recommendations for Outpatient Follow-up:  1. Follow up with Lanier Clam, PA in 1 day  Discharge Diagnoses:  Active Problems:   Hyponatremia   Syncope   Anxiety state   Hyperglycemia   Undifferentiated schizophrenia (HCC)  Discharge Condition: stable  Diet recommendation: regular, fluid restricted  Filed Weights   09/17/15 1923 09/18/15 0103  Weight: 86.637 kg (191 lb) 83.915 kg (185 lb)   History of present illness:  Adam English is a 49 y.o. male with a history of Schizophrenia who presents to the ED afte passing out at home . His mother reports that he was anxious and havign a panic attack and breathing rapidly and passed out. She also reports that he was hearing voices. He was evaluated in the ED and Was found to have Hyponatremia of 119, and a glucose level of 227. His BUN/Cr was <5. And a Cr of 1.34. When asked about his flid consumption, his mother reports that he drinks continuously, water and soda all day. He says that his mouth is dry. He received his monthly Haldol injection 3 days ago.   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 Psychiatry evaluated patient and he does not meed criteria for inpatient hospitalization. Discussed extensively with patient about limiting his fluid intake, he is endorsing large quantities of free water intake. He expressed understanding. His cogentin dose was reduced to help with his dry mouth.  Anxiety - started on prn clonazepam per psychiatry  Hyperglycemia - on admission CBG 227, his CBGs improved to normal range not requiring insulin, A1C still pending at the time of discharge. Hopefully A1C will be available tomorrow when he has an appointment at Urgent care  family medicine. Schizophrenia, On Monthly Haldol Injections, With daily Benztropine Rx,    Procedures:  None   Consultations:  Psychiatry   Discharge Exam: Filed Vitals:   09/18/15 0500 09/18/15 1327 09/18/15 2143 09/19/15 0506  BP: 143/84 133/86 152/81 137/87  Pulse: 100 105 85 68  Temp: 98.4 F (36.9 C) 98.4 F (36.9 C) 98.7 F (37.1 C) 98.7 F (37.1 C)  TempSrc: Oral Oral Oral Oral  Resp: Height:      Weight:      SpO2: 96% 99% 100% 100%    General: NAD Cardiovascular: RRR Respiratory: CTA biL  Discharge Instructions     Medication List    TAKE these medications        benztropine 0.5 MG tablet  Commonly known as:  COGENTIN  Take 1 tablet (0.5 mg total) by mouth 2 (two) times daily.     clonazePAM 0.5 MG tablet  Commonly known as:  KLONOPIN  Take 1 tablet (0.5 mg total) by mouth 2 (two) times daily as needed (anxiety).     haloperidol decanoate 50 MG/ML injection  Commonly known as:  HALDOL DECANOATE  Inject 100 mg into the muscle every 28 (twenty-eight) days.           Follow-up Information    Follow up with [facility]    . Call in 1 day.   Contact information:   [address] [place] [address] [phone]      The results of significant diagnostics from this hospitalization (including  imaging, microbiology, ancillary and laboratory) are listed below for reference.    Significant Diagnostic Studies: Dg Chest Portable 1 View  09/17/2015   CLINICAL DATA:  Elevated lactate.  Initial encounter.  EXAM: PORTABLE CHEST 1 VIEW  COMPARISON:  None.  FINDINGS: The lungs are well-aerated and clear. There is no evidence of focal opacification, pleural effusion or pneumothorax.  The cardiomediastinal silhouette is within normal limits. No acute osseous abnormalities are seen.  IMPRESSION: No acute cardiopulmonary process seen.   Electronically Signed   By: Roanna Raider M.D.   On:  09/17/2015 23:19    Labs: Basic Metabolic Panel:  Recent Labs Lab 09/17/15 1956 09/18/15 0630 09/19/15 0451  NA 119* 128* 129*  K 3.7 4.2 4.1  CL 84* 101 98*  CO2 15* 19* 23  GLUCOSE 227* 112* 94  BUN <5* <5* 7  CREATININE 1.34* 0.99 1.00  CALCIUM 9.5 8.8* 9.4   Liver Function Tests:  Recent Labs Lab 09/17/15 1956  AST 42*  ALT 18  ALKPHOS 67  BILITOT 0.9  PROT 8.0  ALBUMIN 4.6    Recent Labs Lab 09/17/15 1956  LIPASE 31   CBC:  Recent Labs Lab 09/17/15 1956 09/18/15 0630  WBC 8.9 7.6  HGB 14.3 12.9*  HCT 40.3 36.5*  MCV 85.2 85.1  PLT 254 231   CBG:  Recent Labs Lab 09/18/15 0742 09/18/15 1147 09/18/15 1700 09/18/15 2142 09/19/15 0803  GLUCAP 112* 101* 98 105* 90       Signed:  Abdulla Pooley  [facility] 09/19/2015, 12:55 PM

## 2015-09-19 NOTE — Discharge Instructions (Signed)
Call [facility] in 1 day to set up an appointment  Please get a complete blood count and chemistry panel checked by your Primary MD at your next visit, and again as instructed by your Primary MD. Please get your medications reviewed and adjusted by your Primary MD.  Please request your Primary MD to go over all Hospital Tests and Procedure/Radiological results at the follow up, please get all Hospital records sent to your Prim MD by signing hospital release before you go home.  If you had Pneumonia of Lung problems at the Hospital: Please get a 2 view Chest X ray done in 6-8 weeks after hospital discharge or sooner if instructed by your Primary MD.  If you have Congestive Heart Failure: Please call your Cardiologist or Primary MD anytime you have any of the following symptoms:  1) 3 pound weight gain in 24 hours or 5 pounds in 1 week  2) shortness of breath, with or without a dry hacking cough  3) swelling in the hands, feet or stomach  4) if you have to sleep on extra pillows at night in order to breathe  Follow cardiac low salt diet and 1.5 lit/day fluid restriction.  If you have diabetes Accuchecks 4 times/day, Once in AM empty stomach and then before each meal. Log in all results and show them to your primary doctor at your next visit. If any glucose reading is under 80 or above 300 call your primary MD immediately.  If you have Seizure/Convulsions/Epilepsy: Please do not drive, operate heavy machinery, participate in activities at heights or participate in high speed sports until you have seen by Primary MD or a Neurologist and advised to do so again.  If you had Gastrointestinal Bleeding: Please ask your Primary MD to check a complete blood count within one week of discharge or at your next visit. Your endoscopic/colonoscopic biopsies that are pending at the time of discharge, will also need to followed by your Primary MD.  Get Medicines reviewed and  adjusted. Please take all your medications with you for your next visit with your Primary MD  Please request your Primary MD to go over all hospital tests and procedure/radiological results at the follow up, please ask your Primary MD to get all Hospital records sent to his/her office.  If you experience worsening of your admission symptoms, develop shortness of breath, life threatening emergency, suicidal or homicidal thoughts you must seek medical attention immediately by calling 911 or calling your MD immediately  if symptoms less severe.  You must read complete instructions/literature along with all the possible adverse reactions/side effects for all the Medicines you take and that have been prescribed to you. Take any new Medicines after you have completely understood and accpet all the possible adverse reactions/side effects.   Do not drive or operate heavy machinery when taking Pain medications.   Do not take more than prescribed Pain, Sleep and Anxiety Medications  Special Instructions: If you have smoked or chewed Tobacco  in the last 2 yrs please stop smoking, stop any regular Alcohol  and or any Recreational drug use.  Wear Seat belts while driving.  Please note You were cared for by a hospitalist during your hospital stay. If you have any questions about your discharge medications or the care you received while you were in the hospital after you are discharged, you can call the unit and asked to speak with the hospitalist on call if the hospitalist that took care of  you is not available. Once you are discharged, your primary care physician will handle any further medical issues. Please note that NO REFILLS for any discharge medications will be authorized once you are discharged, as it is imperative that you return to your primary care physician (or establish a relationship with a primary care physician if you do not have one) for your aftercare needs so that they can reassess your need  for medications and monitor your lab values.  You can reach the hospitalist office at phone [phone] or fax [phone]   If you do not have a primary care physician, you can call [phone] for a physician referral.  Activity: As tolerated with Full fall precautions use walker/cane & assistance as needed  Diet: regular, water restrict to 1200 ml / day  Disposition Home

## 2015-09-20 ENCOUNTER — Encounter: Payer: Self-pay | Admitting: Physician Assistant

## 2015-09-20 LAB — VOLATILES,BLD-ACETONE,ETHANOL,ISOPROP,METHANOL
ACETONE, BLOOD: NEGATIVE % (ref 0.000–0.010)
ETHANOL, BLOOD: NEGATIVE % (ref 0.000–0.010)
ISOPROPANOL, BLOOD: NEGATIVE % (ref 0.000–0.010)
Methanol, blood: NEGATIVE % (ref 0.000–0.010)

## 2015-09-20 LAB — HEMOGLOBIN A1C
Hgb A1c MFr Bld: 5.7 % — ABNORMAL HIGH (ref 4.8–5.6)
MEAN PLASMA GLUCOSE: 117 mg/dL

## 2015-10-04 ENCOUNTER — Encounter: Payer: Self-pay | Admitting: Family Medicine

## 2015-10-04 ENCOUNTER — Ambulatory Visit: Payer: Self-pay | Attending: Family Medicine | Admitting: Family Medicine

## 2015-10-04 VITALS — BP 124/82 | HR 90 | Temp 98.3°F | Resp 16 | Ht 66.0 in | Wt 199.0 lb

## 2015-10-04 DIAGNOSIS — F203 Undifferentiated schizophrenia: Secondary | ICD-10-CM

## 2015-10-04 DIAGNOSIS — E871 Hypo-osmolality and hyponatremia: Secondary | ICD-10-CM

## 2015-10-04 LAB — COMPREHENSIVE METABOLIC PANEL
ALBUMIN: 4.5 g/dL (ref 3.6–5.1)
ALK PHOS: 80 U/L (ref 40–115)
ALT: 102 U/L — AB (ref 9–46)
AST: 66 U/L — AB (ref 10–40)
BILIRUBIN TOTAL: 0.7 mg/dL (ref 0.2–1.2)
BUN: 9 mg/dL (ref 7–25)
CO2: 25 mmol/L (ref 20–31)
Calcium: 9.8 mg/dL (ref 8.6–10.3)
Chloride: 89 mmol/L — ABNORMAL LOW (ref 98–110)
Creat: 0.89 mg/dL (ref 0.60–1.35)
GLUCOSE: 91 mg/dL (ref 65–99)
POTASSIUM: 5.1 mmol/L (ref 3.5–5.3)
Sodium: 125 mmol/L — ABNORMAL LOW (ref 135–146)
Total Protein: 7.9 g/dL (ref 6.1–8.1)

## 2015-10-04 NOTE — Progress Notes (Signed)
CC: Follow-up from hospitalization  HPI: Adam English is a 49 y.o. male with a medical history of schizophrenia who was recently [facility] - 09/19/15 for Hyponatremia.  Adam English was brought in to the ED by Adam English who had noticed Adam English was anxious and having a panic attack and breathing rapidly and passed out. She also reports that Adam English was hearing voices. Adam English was evaluated in the ED and Was found to have Hyponatremia of 119, and a glucose level of 227. Adam BUN/Cr was <5. And a Cr of 1.34.Adam mother reported that Adam English had been drinking water and soda all day due to persistent thirst. 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 Adam English was also started on clonazepam for anxiety. CXR revealed no acute cardiopulmonary process. Adam English receives monthly Haldol injection from [facility]. At discharge Adam sodium had improved to 129 and Adam English was placed on fluid restriction of 1200 mL per day.  Interval history: Adam English reports doing well but Adam English states that Adam English has not been compliant with Adam fluid restriction of 1200 mL per day. The patient informs me Adam English is frequently thirsty and so keeps drinking water, juices and sodas. Adam English has an upcoming appointment with psychiatry next week. Patient has No headache, No chest pain, No abdominal pain - No Nausea, No new weakness tingling or numbness, No Cough - SOB.  No Known Allergies Past Medical History  Diagnosis Date  . Anxiety   . Panic attacks   . Depression    Current Outpatient Prescriptions on File Prior to Visit  Medication Sig Dispense Refill  . benztropine (COGENTIN) 0.5 MG tablet Take 1 tablet (0.5 mg total) by mouth 2 (two) times daily. 60 tablet 1  . clonazePAM (KLONOPIN) 0.5 MG tablet Take 1 tablet (0.5 mg total) by mouth 2 (two) times daily as needed (anxiety). 30 tablet 0  . haloperidol  decanoate (HALDOL DECANOATE) 50 MG/ML injection Inject 100 mg into the muscle every 28 (twenty-eight) days.     No current facility-administered medications on file prior to visit.   Family History  Problem Relation Age of Onset  . Diabetes Father    Social History   Social History  . Marital Status: Single    Spouse Name: N/A  . Number of Children: N/A  . Years of Education: N/A   Occupational History  . Not on file.   Social History Main Topics  . Smoking status: Never Smoker   . Smokeless tobacco: Not on file  . Alcohol Use: No  . Drug Use: No  . Sexual Activity: Not on file   Other Topics Concern  . Not on file   Social History Narrative    Review of Systems: Constitutional: Negative for fever, chills, diaphoresis, activity change, appetite change and fatigue. HENT: Negative for ear pain, nosebleeds, congestion, facial swelling, rhinorrhea, neck pain, neck stiffness and ear discharge.  Eyes: Negative for pain, discharge, redness, itching and visual disturbance. Respiratory: Negative for cough, choking, chest tightness, shortness of breath, wheezing and stridor.  Cardiovascular: Negative for chest pain, palpitations and leg swelling. Gastrointestinal: Negative for abdominal distention. Genitourinary: Negative for dysuria, urgency, frequency, hematuria, flank pain, decreased urine volume, difficulty urinating and dyspareunia.  Musculoskeletal: Negative for back pain, joint swelling, arthralgias and gait problem. Neurological: Negative for dizziness, tremors, seizures, syncope, facial asymmetry, speech difficulty, weakness, light-headedness, numbness and headaches.  Hematological: Negative for adenopathy. Does not bruise/bleed easily. Psychiatric/Behavioral: Negative for hallucinations, behavioral problems, confusion, dysphoric mood, decreased concentration and agitation.    Objective:   Filed Vitals:   10/04/15 1146  BP: 124/82  Pulse: 90  Temp: 98.3 F (36.8 C)   Resp: 16    Physical Exam: Constitutional: Patient appears well-developed and well-nourished. No distress. HENT: Normocephalic, atraumatic, External right and left ear normal. Oropharynx is clear and moist.  Eyes: Conjunctivae and EOM are normal. PERRLA, no scleral icterus. Neck: Normal ROM. Neck supple. No JVD. No tracheal deviation. No thyromegaly. CVS: RRR, S1/S2 +, no murmurs, no gallops, no carotid bruit.  Pulmonary: Effort and breath sounds normal, no stridor, rhonchi, wheezes, rales.  Abdominal: Soft. BS +,  no distension, tenderness, rebound or guarding.  Musculoskeletal: Normal range of motion. No edema and no tenderness.  Lymphadenopathy: No lymphadenopathy noted, cervical, inguinal or axillary Neuro: Alert. Normal reflexes, muscle tone coordination. No cranial nerve deficit. Skin: Skin is warm and dry. No rash noted. Not diaphoretic. No erythema. No pallor. Psychiatric: Normal mood and affect. Behavior, judgment, thought content normal.  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 Patient continues to be noncompliant with fluid restriction of 1200 mL per day. Comprehensive metabolic panel sent off to evaluate sodium level. Patient is scheduled to see Adam psychiatrist next week and will be discussing Adam persistent thirst as a side effect of Adam medication.  Schizophrenia: Continue Haldol injections monthly Advised to keep appointment with Psychiatry.  Jaclyn Shaggy, MD. [facility] [phone] 10/04/2015, 12:26 PM

## 2015-10-04 NOTE — Progress Notes (Signed)
Pt' here for HFU for hyponatremia and to esc.care with a PCP. Pt denies any pain today. Pt states taking his meds today.

## 2015-10-04 NOTE — Patient Instructions (Signed)
Hyponatremia °Hyponatremia is when the amount of salt (sodium) in your blood is too low. When sodium levels are low, your cells absorb extra water and they swell. The swelling happens throughout the body, but it mostly affects the brain. °CAUSES °This condition may be caused by: °· Heart, kidney, or liver problems. °· Thyroid problems. °· Adrenal gland problems. °· Metabolic conditions, such as syndrome of inappropriate antidiuretic hormone (SIADH). °· Severe vomiting and diarrhea. °· Certain medicines or illegal drugs. °· Dehydration. °· Drinking too much water. °· Eating a diet that is low in sodium. °· Large burns on your body. °· Sweating. °RISK FACTORS °This condition is more likely to develop in people who: °· Have long-term (chronic) kidney disease. °· Have heart failure. °· Have a medical condition that causes frequent or excessive diarrhea. °· Have metabolic conditions, such as Addison disease or SIADH. °· Take certain medicines that affect the sodium and fluid balance in the blood. Some of these medicine types include: °¨ Diuretics. °¨ NSAIDs. °¨ Some opioid pain medicines. °¨ Some antidepressants. °¨ Some seizure prevention medicines. °SYMPTOMS  °Symptoms of this condition include: °· Nausea and vomiting. °· Confusion. °· Lethargy. °· Agitation. °· Headache. °· Seizures. °· Unconsciousness. °· Appetite loss. °· Muscle weakness and cramping. °· Feeling weak or light-headed. °· Having a rapid heart rate. °· Fainting, in severe cases. °DIAGNOSIS °This condition is diagnosed with a medical history and physical exam. You will also have other tests, including: °· Blood tests. °· Urine tests. °TREATMENT °Treatment for this condition depends on the cause. Treatment may include: °· Fluids given through an IV tube that is inserted into one of your veins. °· Medicines to correct the sodium imbalance. If medicines are causing the condition, the medicines will need to be adjusted. °· Limiting water or fluid intake to  get the correct sodium balance. °HOME CARE INSTRUCTIONS °· Take medicines only as directed by your health care provider. Many medicines can make this condition worse. Talk with your health care provider about any medicines that you are currently taking. °· Carefully follow a recommended diet as directed by your health care provider. °· Carefully follow instructions from your health care provider about fluid restrictions. °· Keep all follow-up visits as directed by your health care provider. This is important. °· Do not drink alcohol. °SEEK MEDICAL CARE IF: °· You develop worsening nausea, fatigue, headache, confusion, or weakness. °· Your symptoms go away and then return. °· You have problems following the recommended diet. °SEEK IMMEDIATE MEDICAL CARE IF: °· You have a seizure. °· You faint. °· You have ongoing diarrhea or vomiting. °  °This information is not intended to replace advice given to you by your health care provider. Make sure you discuss any questions you have with your health care provider. °  °Document Released: 11/24/2002 Document Revised: 04/20/2015 Document Reviewed: 12/24/2014 °Elsevier Interactive Patient Education ©2016 Elsevier Inc. ° ° ° °

## 2015-10-05 ENCOUNTER — Telehealth: Payer: Self-pay

## 2015-10-05 ENCOUNTER — Ambulatory Visit: Payer: MEDICAID | Attending: Internal Medicine

## 2015-10-05 NOTE — Telephone Encounter (Signed)
-----   Message from Jaclyn ShaggyEnobong Amao, MD sent at 10/05/2015  8:31 AM EDT ----- Please inform him his sodium is still low; advise him to cut back on his fluid intake to 1200mls daily as instructed at discharge

## 2015-10-05 NOTE — Telephone Encounter (Signed)
CMA called pt, pt was not available, but pt's mother listed in his contact on file was given the message about pt's lab results. Mother expressed no concern and stated that she would inform the pt of his results.

## 2015-10-16 ENCOUNTER — Encounter (HOSPITAL_COMMUNITY): Payer: Self-pay | Admitting: *Deleted

## 2015-10-16 ENCOUNTER — Emergency Department (HOSPITAL_COMMUNITY)
Admission: EM | Admit: 2015-10-16 | Discharge: 2015-10-16 | Disposition: A | Discharge status: Home / Self Care | Payer: Self-pay | Attending: Emergency Medicine | Admitting: Emergency Medicine

## 2015-10-16 DIAGNOSIS — F419 Anxiety disorder, unspecified: Secondary | ICD-10-CM

## 2015-10-16 DIAGNOSIS — F329 Major depressive disorder, single episode, unspecified: Secondary | ICD-10-CM | POA: Insufficient documentation

## 2015-10-16 DIAGNOSIS — F410 Panic disorder [episodic paroxysmal anxiety] without agoraphobia: Secondary | ICD-10-CM | POA: Insufficient documentation

## 2015-10-16 DIAGNOSIS — Z79899 Other long term (current) drug therapy: Secondary | ICD-10-CM | POA: Insufficient documentation

## 2015-10-16 MED ORDER — HYDROXYZINE HCL 25 MG PO TABS
25.0000 mg | ORAL_TABLET | Freq: Once | ORAL | Status: AC
  Administered 2015-10-16: 25 mg via ORAL
  Filled 2015-10-16: qty 1

## 2015-10-16 MED ORDER — HYDROXYZINE HCL 25 MG PO TABS: 25.0000 mg | ORAL_TABLET | Freq: Three times a day (TID) | ORAL | Status: DC

## 2015-10-16 NOTE — ED Notes (Signed)
Hx Scizophrenia, mother reports increased anxiety. Recently tapered dose of Buspar from 10 to 5 mg. Fairly calm throughout transport, did try to unbuckle and take of shirt.  Hypertensive and tachy on scene. BP 190/110 HR 125.

## 2015-10-16 NOTE — ED Notes (Signed)
Awake. Verbally responsive. Resp even and unlabored. ABC's intact. No behavior problems noted. NAD noted. Mother at bedside.

## 2015-10-16 NOTE — ED Notes (Signed)
Bed: [id number] Expected date: 10/16/15 Expected time:  Means of arrival:  Comments: [facility]

## 2015-10-16 NOTE — ED Notes (Signed)
Awake. Verbally responsive. A/O x4. Resp even and unlabored. No audible adventitious breath sounds noted. ABC's intact.  

## 2015-10-16 NOTE — ED Notes (Signed)
[facility] Expected date:  Expected time:  Means of arrival:  Comments: EMS- psychiatric evaluation/anxiety

## 2015-10-16 NOTE — ED Provider Notes (Signed)
CSN: [id number]     Arrival date & time 10/16/15  1522 History   First MD Initiated Contact with Patient 10/16/15 1551     Chief Complaint  Patient presents with  . Anxiety      HPI  Patient presents for evaluation. His complete by his mother. History schizophrenia. Has medication change recently. Is on BuSpar 10 mg daily. Had been on Klonopin 0.5 twice a day or 3 times a day. His mother states this was taken away about a week ago and has had increasing anxiety since had a panic attack 2 days ago and then again today. States when he gets agitated he cannot calm down or take a when necessary medication.  Past Medical History  Diagnosis Date  . Anxiety   . Panic attacks   . Depression    History reviewed. No pertinent past surgical history. Family History  Problem Relation Age of Onset  . Diabetes Father    Social History  Substance Use Topics  . Smoking status: Never Smoker   . Smokeless tobacco: None  . Alcohol Use: No    Review of Systems  Constitutional: Negative for fever, chills, diaphoresis, appetite change and fatigue.  HENT: Negative for mouth sores, sore throat and trouble swallowing.   Eyes: Negative for visual disturbance.  Respiratory: Negative for cough, chest tightness, shortness of breath and wheezing.   Cardiovascular: Negative for chest pain.  Gastrointestinal: Negative for nausea, vomiting, abdominal pain, diarrhea and abdominal distention.  Endocrine: Negative for polydipsia, polyphagia and polyuria.  Genitourinary: Negative for dysuria, frequency and hematuria.  Musculoskeletal: Negative for gait problem.  Skin: Negative for color change, pallor and rash.  Neurological: Negative for dizziness, syncope, light-headedness and headaches.  Hematological: Does not bruise/bleed easily.  Psychiatric/Behavioral: Negative for behavioral problems and confusion. The patient is nervous/anxious.       Allergies  Review of patient's allergies indicates no known  allergies.  Home Medications   Prior to Admission medications   Medication Sig Start Date End Date Taking? Authorizing Provider  benztropine (COGENTIN) 0.5 MG tablet Take 1 tablet (0.5 mg total) by mouth 2 (two) times daily. 09/19/15  Yes Costin Otelia Sergeant, MD  busPIRone (BUSPAR) 10 MG tablet Take 10 mg by mouth 2 (two) times daily.   Yes Historical Provider, MD  haloperidol decanoate (HALDOL DECANOATE) 50 MG/ML injection Inject 100 mg into the muscle every 28 (twenty-eight) days.   Yes Historical Provider, MD  clonazePAM (KLONOPIN) 0.5 MG tablet Take 1 tablet (0.5 mg total) by mouth 2 (two) times daily as needed (anxiety). Patient not taking: Reported on 10/16/2015 09/19/15   Leatha Gilding, MD  hydrOXYzine (ATARAX/VISTARIL) 25 MG tablet Take 1 tablet (25 mg total) by mouth 3 (three) times daily. 10/16/15   Rolland Porter, MD   BP 185/105 mmHg  Pulse 123  Temp(Src) 98.9 F (37.2 C) (Oral)  Resp 18  SpO2 99% Physical Exam  Constitutional: He is oriented to person, place, and time. He appears well-developed and well-nourished. No distress.  HENT:  Head: Normocephalic.  Eyes: Conjunctivae are normal. Pupils are equal, round, and reactive to light. No scleral icterus.  Neck: Normal range of motion. Neck supple. No thyromegaly present.  Cardiovascular: Normal rate and regular rhythm.  Exam reveals no gallop and no friction rub.   No murmur heard. Pulmonary/Chest: Effort normal and breath sounds normal. No respiratory distress. He has no wheezes. He has no rales.  Abdominal: Soft. Bowel sounds are normal. He exhibits no distension. There  is no tenderness. There is no rebound.  Musculoskeletal: Normal range of motion.  Neurological: He is alert and oriented to person, place, and time.  Skin: Skin is warm and dry. No rash noted.  Psychiatric: He has a normal mood and affect. His behavior is normal.    ED Course  Procedures (including critical care time) Labs Review Labs Reviewed - No data  to display  Imaging Review No results found. I have personally reviewed and evaluated these images and lab results as part of my medical decision-making.   EKG Interpretation None      MDM   Final diagnoses:  Anxiety    I discussed the case at length with the patient and his mom. They do not want to see psychiatrist sedated his request something to use when he has his panic attacks. Plan is At[facility] and [facility] follow-up.    Rolland PorterMark Carollyn Etcheverry, MD 10/16/15 1729

## 2015-10-16 NOTE — Discharge Instructions (Signed)
Panic Attacks °Panic attacks are sudden, short feelings of great fear or discomfort. You may have them for no reason when you are relaxed, when you are uneasy (anxious), or when you are sleeping.  °HOME CARE °· Take all your medicines as told. °· Check with your doctor before starting new medicines. °· Keep all doctor visits. °GET HELP IF: °· You are not able to take your medicines as told. °· Your symptoms do not get better. °· Your symptoms get worse. °GET HELP RIGHT AWAY IF: °· Your attacks seem different than your normal attacks. °· You have thoughts about hurting yourself or others. °· You take panic attack medicine and you have a side effect. °MAKE SURE YOU: °· Understand these instructions. °· Will watch your condition. °· Will get help right away if you are not doing well or get worse. °  °This information is not intended to replace advice given to you by your health care provider. Make sure you discuss any questions you have with your health care provider. °  °Document Released: 01/06/2011 Document Revised: 09/24/2013 Document Reviewed: 07/18/2013 °Elsevier Interactive Patient Education ©2016 Elsevier Inc. ° °

## 2015-10-16 NOTE — ED Notes (Signed)
Resting quietly with eye closed. Easily arousable. Verbally responsive. Resp even and unlabored. ABC's intact. No behavior problems noted. NAD noted. Mother at bedside.

## 2015-10-18 ENCOUNTER — Encounter (HOSPITAL_COMMUNITY): Payer: Self-pay | Admitting: Emergency Medicine

## 2015-10-18 ENCOUNTER — Emergency Department (HOSPITAL_COMMUNITY)
Admission: EM | Admit: 2015-10-18 | Discharge: 2015-10-19 | Disposition: A | Discharge status: Other Acute Inpatient Hospital | Payer: Self-pay | Attending: Emergency Medicine | Admitting: Emergency Medicine

## 2015-10-18 DIAGNOSIS — Z79899 Other long term (current) drug therapy: Secondary | ICD-10-CM | POA: Insufficient documentation

## 2015-10-18 DIAGNOSIS — F329 Major depressive disorder, single episode, unspecified: Secondary | ICD-10-CM | POA: Insufficient documentation

## 2015-10-18 DIAGNOSIS — F419 Anxiety disorder, unspecified: Secondary | ICD-10-CM | POA: Insufficient documentation

## 2015-10-18 DIAGNOSIS — R61 Generalized hyperhidrosis: Secondary | ICD-10-CM | POA: Insufficient documentation

## 2015-10-18 DIAGNOSIS — F2089 Other schizophrenia: Secondary | ICD-10-CM | POA: Insufficient documentation

## 2015-10-18 HISTORY — DX: Schizophrenia, unspecified: F20.9

## 2015-10-18 LAB — RAPID URINE DRUG SCREEN, HOSP PERFORMED
Amphetamines: NOT DETECTED
BARBITURATES: NOT DETECTED
BENZODIAZEPINES: NOT DETECTED
COCAINE: NOT DETECTED
Opiates: NOT DETECTED
Tetrahydrocannabinol: NOT DETECTED

## 2015-10-18 LAB — COMPREHENSIVE METABOLIC PANEL
ALBUMIN: 3.8 g/dL (ref 3.5–5.0)
ALT: 38 U/L (ref 17–63)
ANION GAP: 10 (ref 5–15)
AST: 60 U/L — ABNORMAL HIGH (ref 15–41)
Alkaline Phosphatase: 72 U/L (ref 38–126)
BILIRUBIN TOTAL: 1 mg/dL (ref 0.3–1.2)
BUN: 14 mg/dL (ref 6–20)
CALCIUM: 9.4 mg/dL (ref 8.9–10.3)
CO2: 23 mmol/L (ref 22–32)
Chloride: 99 mmol/L — ABNORMAL LOW (ref 101–111)
Creatinine, Ser: 1.11 mg/dL (ref 0.61–1.24)
GFR calc non Af Amer: >60 mL/min (ref >60)
GLUCOSE: 123 mg/dL — AB (ref 65–99)
POTASSIUM: 3.7 mmol/L (ref 3.5–5.1)
SODIUM: 132 mmol/L — AB (ref 135–145)
TOTAL PROTEIN: 7.2 g/dL (ref 6.5–8.1)

## 2015-10-18 LAB — CBC WITH DIFFERENTIAL/PLATELET
Basophils Absolute: 0 10*3/uL (ref 0.0–0.1)
Basophils Relative: 0 %
EOS ABS: 0.1 10*3/uL (ref 0.0–0.7)
Eosinophils Relative: 1 %
HCT: 38.7 % — ABNORMAL LOW (ref 39.0–52.0)
Hemoglobin: 13.2 g/dL (ref 13.0–17.0)
LYMPHS ABS: 1.2 10*3/uL (ref 0.7–4.0)
Lymphocytes Relative: 12 %
MCH: 30.6 pg (ref 26.0–34.0)
MCHC: 34.1 g/dL (ref 30.0–36.0)
MCV: 89.6 fL (ref 78.0–100.0)
MONO ABS: 1.1 10*3/uL — AB (ref 0.1–1.0)
MONOS PCT: 10 %
NEUTROS ABS: 8.1 10*3/uL — AB (ref 1.7–7.7)
Neutrophils Relative %: 77 %
Platelets: 286 10*3/uL (ref 150–400)
RBC: 4.32 MIL/uL (ref 4.22–5.81)
RDW: 13.5 % (ref 11.5–15.5)
WBC: 10.5 10*3/uL (ref 4.0–10.5)

## 2015-10-18 LAB — ETHANOL

## 2015-10-18 MED ORDER — BUSPIRONE HCL 10 MG PO TABS
10.0000 mg | ORAL_TABLET | Freq: Two times a day (BID) | ORAL | Status: DC
  Administered 2015-10-19 (×2): 10 mg via ORAL
  Filled 2015-10-18 (×2): qty 1

## 2015-10-18 MED ORDER — BENZTROPINE MESYLATE 1 MG PO TABS
0.5000 mg | ORAL_TABLET | Freq: Two times a day (BID) | ORAL | Status: DC
  Administered 2015-10-19 (×2): 0.5 mg via ORAL
  Filled 2015-10-18 (×2): qty 1

## 2015-10-18 NOTE — ED Notes (Signed)
Security notified to wand pt., pt. wearing paper scrubs.

## 2015-10-18 NOTE — ED Notes (Addendum)
Pt states that he is here because "My mother said I wasn't taking my pills" Pt denies si/hi. Pt states "I'm not taking my pills because they don't taste good, and my mind hasn't been right for a long time. My mind is telling me to be right with my mother." Pt states that he is not sleeping well. Pt states that his mother says "that I was hurting her"  Pt denies drug/alcohol use.

## 2015-10-18 NOTE — BH Assessment (Addendum)
Tele Assessment Note   Adam English is an 49 y.o. male.  -Clinician reviewed note by Dr. Silverio Lay.  Patient's mother said that patient has been having unusual behavior for the last few weeks.  Has not been taking his medicine as prescribed.  Patient has been talking to himself, removing his clothing and staying in the closet.  Hearing voices.  Patient denies any SI or HI.  He says he does not hear voices now but has in the past.  Patient gives short answers and may not answer questions clearly.  He appears confused.  Patient was asked if he would be willing to come in for inpatient care and said that he would.  Patient lives with his mother.  She said that he has been taking his medication as prescribed because she has been giving it to him.  Mother said that his medications are Buspirone  3x/D; Benztropine .  2x/D; at [facility] today he was given Trazadone  qhs.  Patient also gets Haldol IM  every 28 days (last time was on 10/26).  Mother said that usually about two days after the injection he has increased anxiety problems.  Mother said that she noticed this in September.  During anxiety attacks patient may act like he does not know people and understand what they are talking about.  Mother said that Sunday (10/30) was trying to leave the house without any clothing.  He will take his clothes off and wander around the house.  Mother said that patient and she came out to [facility] on Saturday (10/29) because of anxiety.  Patient has had one possible hospitalization in 1998 at a facility in [place].  Patient has been to [facility] and [place] since 2002.   -Clinician reviewed patient care with Donell Sievert, PA.  He recommends patient for inpatient care and stabilization of symptoms.  Patient may be better off with a single room.  Tori, [facility] said that patient can go to [facility] 504-1.  Can come after 08:00.  Dr. Elesa Massed at [facility] notified that patient has been accepted and can come after 08:00 on 11/01.    Diagnosis:   Axis 1: Schizophrenia, Depressive d/o Axis 2 deferred Axis 3: See H & P Axis 4: psychosocial problems Axis 5 GAF: 31  Past Medical History:  Past Medical History  Diagnosis Date  . Anxiety   . Panic attacks   . Depression   . Schizophrenia (HCC)     History reviewed. No pertinent past surgical history.  Family History:  Family History  Problem Relation Age of Onset  . Diabetes Father     Social History:  reports that he has never smoked. He does not have any smokeless tobacco history on file. He reports that he does not drink alcohol or use illicit drugs.  Additional Social History:  Alcohol / Drug Use Pain Medications: None Prescriptions: Pt does not know what meds he is taking. Over the Counter: None History of alcohol / drug use?: No history of alcohol / drug abuse  CIWA: CIWA-Ar BP: 148/95 mmHg Pulse Rate: 101 COWS:    PATIENT STRENGTHS: (choose at least two) Average or above average intelligence Supportive family/friends  Allergies: No Known Allergies  Home Medications:  (Not in a hospital admission)  OB/GYN Status:  No LMP for male patient.  General Assessment Data Location of Assessment: [facility] ED TTS Assessment: In system Is this a Tele or Face-to-Face Assessment?: Tele Assessment Is this an Initial Assessment or a Re-assessment for this encounter?: Initial  Assessment Marital status: Single Is patient pregnant?: No Pregnancy Status: No Living Arrangements: Parent (Lives with mother) Can pt return to current living arrangement?: Yes Admission Status: Voluntary Is patient capable of signing voluntary admission?: Yes Referral Source: Self/Family/Friend Insurance type: MCD     Crisis Care Plan Living Arrangements: Parent (Lives with mother) Name of Psychiatrist: Transport planner Name of Therapist: Transport planner  Education Status Is patient currently in school?: No Highest grade of school patient has completed: Some community college  Risk to self with the  past 6 months Suicidal Ideation: No Has patient been a risk to self within the past 6 months prior to admission? : No Suicidal Intent: No Has patient had any suicidal intent within the past 6 months prior to admission? : No Is patient at risk for suicide?: No Suicidal Plan?: No Has patient had any suicidal plan within the past 6 months prior to admission? : No Access to Means: No What has been your use of drugs/alcohol within the last 12 months?: None Previous Attempts/Gestures: No How many times?: 0 Other Self Harm Risks: None Triggers for Past Attempts: None known Intentional Self Injurious Behavior: None Family Suicide History: No Recent stressful life event(s): Other (Comment) (Pt cannot identify a stressor.  Has been off meds lately.) Persecutory voices/beliefs?: No Depression: No Depression Symptoms: Isolating (Pt denies depressive symptoms.) Substance abuse history and/or treatment for substance abuse?: No Suicide prevention information given to non-admitted patients: Not applicable  Risk to Others within the past 6 months Homicidal Ideation: No Does patient have any lifetime risk of violence toward others beyond the six months prior to admission? : No Thoughts of Harm to Others: No Current Homicidal Intent: No Current Homicidal Plan: No Access to Homicidal Means: No Identified Victim: No one History of harm to others?: No Assessment of Violence: In distant past Violent Behavior Description: Pt calm and cooperative Does patient have access to weapons?: No Criminal Charges Pending?: No Does patient have a court date: No Is patient on probation?: No  Psychosis Hallucinations: Auditory (Hearing voices 1.5 years ago.) Delusions: None noted  Mental Status Report Appearance/Hygiene: Unremarkable, In scrubs Eye Contact: Good Motor Activity: Freedom of movement, Unremarkable Speech: Slow, Pressured Level of Consciousness: Alert Mood: Depressed, Suspicious, Sad Affect:  Depressed, Blunted Anxiety Level: Minimal Thought Processes: Irrelevant Judgement: Unimpaired Orientation: Person, Situation Obsessive Compulsive Thoughts/Behaviors: None  Cognitive Functioning Concentration: Decreased Memory: Remote Intact, Recent Impaired IQ: Average Insight: Poor Impulse Control: Poor Appetite: Good Weight Loss: 0 Weight Gain: 0 Sleep: No Change Total Hours of Sleep: 7 Vegetative Symptoms: None  ADLScreening [facility] Assessment Services) Patient's cognitive ability adequate to safely complete daily activities?: Yes Patient able to express need for assistance with ADLs?: Yes Independently performs ADLs?: Yes (appropriate for developmental age) (Mother, sbrother that lives in [place])  Prior Inpatient Therapy Prior Inpatient Therapy: Yes Prior Therapy Dates: 1998 Prior Therapy Facilty/Provider(s): Facility in [place] Reason for Treatment: Depression  Prior Outpatient Therapy Prior Outpatient Therapy: Yes Prior Therapy Dates: Over 20 years Prior Therapy Facilty/Provider(s): [facility] & Transport planner Reason for Treatment: med management Does patient have an ACCT team?: No Does patient have Intensive In-House Services?  : No Does patient have [place] services? : Yes Does patient have P4CC services?: No  ADL Screening (condition at time of admission) Patient's cognitive ability adequate to safely complete daily activities?: Yes Is the patient deaf or have difficulty hearing?: No Does the patient have difficulty seeing, even when wearing glasses/contacts?: No Does the patient have difficulty concentrating, remembering, or  making decisions?: Yes Patient able to express need for assistance with ADLs?: Yes Does the patient have difficulty dressing or bathing?: No Independently performs ADLs?: Yes (appropriate for developmental age) (Mother, sbrother that lives in [place]) Does the patient have difficulty walking or climbing stairs?: No Weakness of Legs: None Weakness of  Arms/Hands: None       Abuse/Neglect Assessment (Assessment to be complete while patient is alone) Physical Abuse: Denies Verbal Abuse: Denies Sexual Abuse: Denies Exploitation of patient/patient's resources: Denies Self-Neglect: Denies     Merchant navy officerAdvance Directives (For Healthcare) Does patient have an advance directive?: No Would patient like information on creating an advanced directive?: No - patient declined information    Additional Information 1:1 In Past 12 Months?: No CIRT Risk: No Elopement Risk: No Does patient have medical clearance?: Yes     Disposition:  Disposition Initial Assessment Completed for this Encounter: Yes Disposition of Patient: Other dispositions Other disposition(s): Other (Comment) (To be reviewed with PA)  Beatriz StallionHarvey, Lucindy Borel Ray 10/18/2015 11:48 PM

## 2015-10-18 NOTE — ED Notes (Signed)
Pt. wanded by security at triage .  

## 2015-10-18 NOTE — ED Provider Notes (Signed)
CSN: [id number]     Arrival date & time 10/18/15  1945 History   First MD Initiated Contact with Patient 10/18/15 2206     Chief Complaint  Patient presents with  . Behavior Problem     (Consider location/radiation/quality/duration/timing/severity/associated sxs/prior Treatment) The history is provided by the patient.  Adam English is a 49 y.o. male history depression, schizophrenia, anxiety here presenting with behavioral problems, possible agitation. Has been more agitated as per mother. Came in several weeks ago for anxiety and was prescribed Atarax. Patient's mother said that he has not been taking his medicines. Patient states that he doesn't take them because they don't taste good. He states that "he is mind has not been right in that his mind is telling him to be right with his mother." He states that he has not been sleeping well and that he may have been hurting his mom. He denies any suicidal ideations or homicidal ideations. When asked about hallucinations, he states that he had some hallucinations but no longer has them.    Past Medical History  Diagnosis Date  . Anxiety   . Panic attacks   . Depression   . Schizophrenia (HCC)    History reviewed. No pertinent past surgical history. Family History  Problem Relation Age of Onset  . Diabetes Father    Social History  Substance Use Topics  . Smoking status: Never Smoker   . Smokeless tobacco: None  . Alcohol Use: No    Review of Systems  Psychiatric/Behavioral: Positive for sleep disturbance and dysphoric mood. The patient is nervous/anxious.   All other systems reviewed and are negative.     Allergies  Review of patient's allergies indicates no known allergies.  Home Medications   Prior to Admission medications   Medication Sig Start Date End Date Taking? Authorizing Provider  benztropine (COGENTIN) 0.5 MG tablet Take 1 tablet (0.5 mg total) by mouth 2 (two) times daily. 09/19/15   Costin Otelia SergeantM Gherghe, MD   busPIRone (BUSPAR) 10 MG tablet Take 10 mg by mouth 2 (two) times daily.    Historical Provider, MD  clonazePAM (KLONOPIN) 0.5 MG tablet Take 1 tablet (0.5 mg total) by mouth 2 (two) times daily as needed (anxiety). Patient not taking: Reported on 10/16/2015 09/19/15   Leatha Gildingostin M Gherghe, MD  haloperidol decanoate (HALDOL DECANOATE) 50 MG/ML injection Inject 100 mg into the muscle every 28 (twenty-eight) days.    Historical Provider, MD  hydrOXYzine (ATARAX/VISTARIL) 25 MG tablet Take 1 tablet (25 mg total) by mouth 3 (three) times daily. 10/16/15   Rolland PorterMark James, MD   BP 148/95 mmHg  Pulse 101  Temp(Src) 99.1 F (37.3 C) (Oral)  Resp 14  Ht 5\' 6"  (1.676 m)  Wt 185 lb 9 oz (84.171 kg)  BMI 29.97 kg/m2  SpO2 98% Physical Exam  Constitutional: He is oriented to person, place, and time.  Withdrawn   HENT:  Head: Normocephalic.  Mouth/Throat: Oropharynx is clear and moist.  Eyes: Conjunctivae are normal. Pupils are equal, round, and reactive to light.  Neck: Normal range of motion. Neck supple.  Cardiovascular: Normal rate, regular rhythm and normal heart sounds.   Pulmonary/Chest: Effort normal and breath sounds normal. No respiratory distress. He has no wheezes. He has no rales.  Abdominal: Soft. Bowel sounds are normal. He exhibits no distension. There is no tenderness. There is no rebound.  Musculoskeletal: Normal range of motion.  Neurological: He is alert and oriented to person, place, and time.  Skin: Skin  is warm and dry.  Psychiatric:  Withdrawn, poor judgement   Nursing note and vitals reviewed.   ED Course  Procedures (including critical care time) Labs Review Labs Reviewed  CBC WITH DIFFERENTIAL/PLATELET - Abnormal; Notable for the following:    HCT 38.7 (*)    Neutro Abs 8.1 (*)    Monocytes Absolute 1.1 (*)    All other components within normal limits  COMPREHENSIVE METABOLIC PANEL - Abnormal; Notable for the following:    Sodium 132 (*)    Chloride 99 (*)     Glucose, Bld 123 (*)    AST 60 (*)    All other components within normal limits  ETHANOL  URINE RAPID DRUG SCREEN, HOSP PERFORMED    Imaging Review No results found. I have personally reviewed and evaluated these images and lab results as part of my medical decision-making.   EKG Interpretation None      MDM   Final diagnoses:  None    Adam English is a 49 y.o. male here because his "mind is not right". Triage states that had patient has auditory hallucinations but denies them currently. Difficult to examine since he is very withdrawn. Denies any overdose. Will get TTS consult.   10:22 PM Labs at baseline, Na was 125 before now 132. Tox neg. Consulted TTS. Medically cleared.     Richardean Canal, MD 10/18/15 2222

## 2015-10-18 NOTE — ED Notes (Signed)
Mother reported behavior problems for several weeks : auditory hallucinations , removing his clothes and staying inside closet , insomnia and speaking to himself . Pt. denies suicidal ideation .

## 2015-10-18 NOTE — ED Notes (Signed)
Gave pt ice water, per Kevin - RN 

## 2015-10-19 ENCOUNTER — Encounter (HOSPITAL_COMMUNITY): Payer: Self-pay

## 2015-10-19 ENCOUNTER — Emergency Department (HOSPITAL_COMMUNITY): Payer: Self-pay

## 2015-10-19 ENCOUNTER — Inpatient Hospital Stay (HOSPITAL_COMMUNITY)
Admission: EM | Admit: 2015-10-19 | Discharge: 2015-10-28 | DRG: 885 | Disposition: A | Discharge status: Home / Self Care | Payer: Federal, State, Local not specified - Other | Source: Intra-hospital | Attending: Emergency Medicine | Admitting: Emergency Medicine

## 2015-10-19 DIAGNOSIS — R4182 Altered mental status, unspecified: Secondary | ICD-10-CM

## 2015-10-19 DIAGNOSIS — R000 Tachycardia, unspecified: Secondary | ICD-10-CM

## 2015-10-19 DIAGNOSIS — F203 Undifferentiated schizophrenia: Secondary | ICD-10-CM | POA: Diagnosis not present

## 2015-10-19 DIAGNOSIS — F209 Schizophrenia, unspecified: Principal | ICD-10-CM | POA: Diagnosis present

## 2015-10-19 DIAGNOSIS — R079 Chest pain, unspecified: Secondary | ICD-10-CM | POA: Diagnosis present

## 2015-10-19 DIAGNOSIS — Z833 Family history of diabetes mellitus: Secondary | ICD-10-CM | POA: Diagnosis not present

## 2015-10-19 DIAGNOSIS — R410 Disorientation, unspecified: Secondary | ICD-10-CM | POA: Diagnosis present

## 2015-10-19 DIAGNOSIS — F410 Panic disorder [episodic paroxysmal anxiety] without agoraphobia: Secondary | ICD-10-CM | POA: Diagnosis present

## 2015-10-19 DIAGNOSIS — R251 Tremor, unspecified: Secondary | ICD-10-CM | POA: Clinically undetermined

## 2015-10-19 DIAGNOSIS — I10 Essential (primary) hypertension: Secondary | ICD-10-CM | POA: Diagnosis present

## 2015-10-19 DIAGNOSIS — F411 Generalized anxiety disorder: Secondary | ICD-10-CM | POA: Diagnosis present

## 2015-10-19 DIAGNOSIS — M79672 Pain in left foot: Secondary | ICD-10-CM

## 2015-10-19 DIAGNOSIS — F29 Unspecified psychosis not due to a substance or known physiological condition: Secondary | ICD-10-CM | POA: Diagnosis present

## 2015-10-19 LAB — I-STAT CHEM 8, ED
BUN: 12 mg/dL (ref 6–20)
CALCIUM ION: 1.14 mmol/L (ref 1.12–1.23)
Chloride: 102 mmol/L (ref 101–111)
Creatinine, Ser: 1 mg/dL (ref 0.61–1.24)
Glucose, Bld: 122 mg/dL — ABNORMAL HIGH (ref 65–99)
HCT: 42 % (ref 39.0–52.0)
Hemoglobin: 14.3 g/dL (ref 13.0–17.0)
Potassium: 3.6 mmol/L (ref 3.5–5.1)
SODIUM: 137 mmol/L (ref 135–145)
TCO2: 20 mmol/L (ref 0–100)

## 2015-10-19 LAB — CBG MONITORING, ED: Glucose-Capillary: 125 mg/dL — ABNORMAL HIGH (ref 65–99)

## 2015-10-19 LAB — I-STAT TROPONIN, ED: Troponin i, poc: 0 ng/mL (ref 0.00–0.08)

## 2015-10-19 MED ORDER — BENZTROPINE MESYLATE 0.5 MG PO TABS
0.5000 mg | ORAL_TABLET | Freq: Two times a day (BID) | ORAL | Status: DC
  Administered 2015-10-19: 0.5 mg via ORAL
  Filled 2015-10-19 (×5): qty 1

## 2015-10-19 MED ORDER — CLONAZEPAM 0.5 MG PO TABS
0.5000 mg | ORAL_TABLET | Freq: Two times a day (BID) | ORAL | Status: DC | PRN
  Administered 2015-10-19: 0.5 mg via ORAL
  Filled 2015-10-19: qty 1

## 2015-10-19 MED ORDER — ALUM & MAG HYDROXIDE-SIMETH 200-200-20 MG/5ML PO SUSP: 30.0000 mL | ORAL | Status: DC | PRN

## 2015-10-19 MED ORDER — BUSPIRONE HCL 10 MG PO TABS
10.0000 mg | ORAL_TABLET | Freq: Two times a day (BID) | ORAL | Status: DC
  Administered 2015-10-19: 10 mg via ORAL
  Filled 2015-10-19: qty 1
  Filled 2015-10-19: qty 2
  Filled 2015-10-19: qty 1

## 2015-10-19 MED ORDER — ACETAMINOPHEN 325 MG PO TABS
650.0000 mg | ORAL_TABLET | Freq: Four times a day (QID) | ORAL | Status: DC | PRN
  Administered 2015-10-21 – 2015-10-23 (×2): 650 mg via ORAL
  Filled 2015-10-19 (×2): qty 2

## 2015-10-19 MED ORDER — SODIUM CHLORIDE 0.9 % IV SOLN
INTRAVENOUS | Status: DC
  Administered 2015-10-19: 09:00:00 via INTRAVENOUS

## 2015-10-19 MED ORDER — LORAZEPAM 2 MG/ML IJ SOLN
1.0000 mg | Freq: Once | INTRAMUSCULAR | Status: AC
  Administered 2015-10-19: 1 mg via INTRAVENOUS

## 2015-10-19 MED ORDER — MAGNESIUM HYDROXIDE 400 MG/5ML PO SUSP: 30.0000 mL | Freq: Every day | ORAL | Status: DC | PRN

## 2015-10-19 MED ORDER — LORAZEPAM 2 MG/ML IJ SOLN
INTRAMUSCULAR | Status: AC
  Filled 2015-10-19: qty 1

## 2015-10-19 MED ORDER — SODIUM CHLORIDE 0.9 % IV BOLUS (SEPSIS)
1000.0000 mL | Freq: Once | INTRAVENOUS | Status: AC
  Administered 2015-10-19: 1000 mL via INTRAVENOUS

## 2015-10-19 NOTE — ED Notes (Signed)
Security in room to wand patient.

## 2015-10-19 NOTE — ED Notes (Signed)
Pt to CT

## 2015-10-19 NOTE — ED Provider Notes (Signed)
  Physical Exam  BP 148/99 mmHg  Pulse 107  Temp(Src) 98.9 F (37.2 C) (Oral)  Resp 26  Ht 5\' 6"  (1.676 m)  Wt 185 lb 9 oz (84.171 kg)  BMI 29.97 kg/m2  SpO2 99%  Physical Exam  ED Course  Procedures  MDM Patient has had 2 negative troponins and negative head CT. Back at baseline. Reportedly has had episodes like this in the past. Will transfer to behavioral health. Patient appears to medically cleared at this time.      Benjiman CoreNathan Ferrell Claiborne, MD 10/19/15 [id number]

## 2015-10-19 NOTE — ED Notes (Signed)
Patient found in bed, profusely sweating, tremulous, tachycardic and tachypneic.   Dr Elesa MassedWard notified.  New orders per Dr Elesa MassedWard.  Patient moved back to Pod A for cardiac monitoring.  CIWA done at this time.

## 2015-10-19 NOTE — Progress Notes (Signed)
This a 49 [facility] increase in unusaul behavior. Pt stated that he has a problem with his mom she make him take a lot of medication which cause to be too sleepy. Pt was cooperative during admission. Pt endorses tremors and was very slow. Pt denies SI, Hi and contracted for safety. Pt was introduced to the unit and shown his room. Q 15 min checks maintained for safety, we will continue to monitor.

## 2015-10-19 NOTE — Tx Team (Signed)
Initial Interdisciplinary Treatment Plan   PATIENT STRESSORS: Financial difficulties Health problems Marital or family conflict   PATIENT STRENGTHS: Ability for insight Communication skills Supportive family/friends   PROBLEM LIST: Problem List/Patient Goals Date to be addressed Date deferred Reason deferred Estimated date of resolution  ' Being able to perform like everbody else." 11/1/116     Getting my medication straighten out." 10/19/15     Depression 10/19/15     Psychosocial problems. 10/19/15                                    DISCHARGE CRITERIA:  Improved stabilization in mood, thinking, and/or behavior Motivation to continue treatment in a less acute level of care Reduction of life-threatening or endangering symptoms to within safe limits  PRELIMINARY DISCHARGE PLAN: Attend aftercare/continuing care group Attend PHP/IOP Outpatient therapy  PATIENT/FAMIILY INVOLVEMENT: This treatment plan has been presented to and reviewed with the patient, Adam English, and/or family member.  The patient and family have been given the opportunity to ask questions and make suggestions.  Adam English 10/19/2015, 5:11 PM

## 2015-10-19 NOTE — ED Notes (Signed)
Assisted in cleaning up room. Pt found by other RN to be naked in room holding urinal in his hand and taken to shower by that RN. Bed linen changed by this RN and floor cleaned up. Pt's boxers wet and placed in belongings bag and placed at the desk at this time.

## 2015-10-19 NOTE — Progress Notes (Signed)
Spoke with L. Earlene Plateravis, [facility], who reviewed pt's lab results/vitals and requested updated CIWA score for pt.  [facility] RN provided CIWA scoring- pt now scoring 6. NP states this is within [facility] By Dr. Elna BreslowEappen. Admission is voluntary.   Ilean SkillMeghan Gianna Calef, MSW, LCSW Clinical Social Work, Disposition  10/19/2015 [id number]

## 2015-10-19 NOTE — ED Notes (Signed)
PER [facility] CONFERENCE, PT ACCEPTED TO [facility] 504-2 WHEN MEDICALLY CLEARED.

## 2015-10-19 NOTE — ED Notes (Signed)
Dr. Ward at bedside at this time.  

## 2015-10-19 NOTE — Progress Notes (Signed)
Did not attend group 

## 2015-10-19 NOTE — ED Provider Notes (Signed)
  Physical Exam  BP 133/75 mmHg  Pulse 85  Temp(Src) 98.9 F (37.2 C) (Oral)  Resp 26  Ht 5\' 6"  (1.676 m)  Wt 185 lb 9 oz (84.171 kg)  BMI 29.97 kg/m2  SpO2 100%  Physical Exam  ED Course  Procedures  MDM Patient reevaluated and appears stable for transfer.      Benjiman CoreNathan Allean Montfort, MD 10/19/15 1149
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
## 2015-10-20 ENCOUNTER — Encounter (HOSPITAL_COMMUNITY): Payer: Self-pay | Admitting: Emergency Medicine

## 2015-10-20 ENCOUNTER — Inpatient Hospital Stay: Admission: AD | Admit: 2015-10-20 | Payer: Self-pay | Admitting: Family Medicine

## 2015-10-20 DIAGNOSIS — R0789 Other chest pain: Secondary | ICD-10-CM

## 2015-10-20 DIAGNOSIS — R4182 Altered mental status, unspecified: Secondary | ICD-10-CM

## 2015-10-20 DIAGNOSIS — F203 Undifferentiated schizophrenia: Secondary | ICD-10-CM

## 2015-10-20 DIAGNOSIS — R410 Disorientation, unspecified: Secondary | ICD-10-CM | POA: Diagnosis present

## 2015-10-20 DIAGNOSIS — F29 Unspecified psychosis not due to a substance or known physiological condition: Secondary | ICD-10-CM | POA: Diagnosis present

## 2015-10-20 DIAGNOSIS — R079 Chest pain, unspecified: Secondary | ICD-10-CM | POA: Diagnosis present

## 2015-10-20 DIAGNOSIS — F209 Schizophrenia, unspecified: Secondary | ICD-10-CM | POA: Diagnosis present

## 2015-10-20 LAB — CBC WITH DIFFERENTIAL/PLATELET
Basophils Absolute: 0 10*3/uL (ref 0.0–0.1)
Basophils Relative: 0 %
Eosinophils Absolute: 0 10*3/uL (ref 0.0–0.7)
Eosinophils Relative: 0 %
HEMATOCRIT: 35.4 % — AB (ref 39.0–52.0)
Hemoglobin: 12 g/dL — ABNORMAL LOW (ref 13.0–17.0)
LYMPHS PCT: 11 %
Lymphs Abs: 1 10*3/uL (ref 0.7–4.0)
MCH: 30.5 pg (ref 26.0–34.0)
MCHC: 33.9 g/dL (ref 30.0–36.0)
MCV: 90.1 fL (ref 78.0–100.0)
MONO ABS: 0.9 10*3/uL (ref 0.1–1.0)
MONOS PCT: 10 %
NEUTROS ABS: 7.3 10*3/uL (ref 1.7–7.7)
Neutrophils Relative %: 79 %
Platelets: 253 10*3/uL (ref 150–400)
RBC: 3.93 MIL/uL — ABNORMAL LOW (ref 4.22–5.81)
RDW: 13.5 % (ref 11.5–15.5)
WBC: 9.2 10*3/uL (ref 4.0–10.5)

## 2015-10-20 LAB — I-STAT CHEM 8, ED
BUN: 12 mg/dL (ref 6–20)
CALCIUM ION: 1.17 mmol/L (ref 1.12–1.23)
CHLORIDE: 104 mmol/L (ref 101–111)
Creatinine, Ser: 0.9 mg/dL (ref 0.61–1.24)
GLUCOSE: 105 mg/dL — AB (ref 65–99)
HCT: 37 % — ABNORMAL LOW (ref 39.0–52.0)
Hemoglobin: 12.6 g/dL — ABNORMAL LOW (ref 13.0–17.0)
Potassium: 4.3 mmol/L (ref 3.5–5.1)
Sodium: 137 mmol/L (ref 135–145)
TCO2: 23 mmol/L (ref 0–100)

## 2015-10-20 LAB — GLUCOSE, CAPILLARY: GLUCOSE-CAPILLARY: 107 mg/dL — AB (ref 65–99)

## 2015-10-20 LAB — AMMONIA: AMMONIA: 36 umol/L — AB (ref 9–35)

## 2015-10-20 LAB — I-STAT TROPONIN, ED: TROPONIN I, POC: 0.02 ng/mL (ref 0.00–0.08)

## 2015-10-20 MED ORDER — BENZTROPINE MESYLATE 1 MG/ML IJ SOLN
INTRAMUSCULAR | Status: AC
  Administered 2015-10-20: 1 mg via INTRAMUSCULAR
  Filled 2015-10-20: qty 2

## 2015-10-20 MED ORDER — SODIUM CHLORIDE 0.9 % IV SOLN
250.0000 mL | INTRAVENOUS | Status: DC | PRN
  Filled 2015-10-20: qty 250

## 2015-10-20 MED ORDER — ASPIRIN 81 MG PO CHEW
CHEWABLE_TABLET | ORAL | Status: AC
  Administered 2015-10-20: 324 mg via ORAL
  Filled 2015-10-20: qty 4

## 2015-10-20 MED ORDER — BENZTROPINE MESYLATE 0.5 MG PO TABS
0.5000 mg | ORAL_TABLET | Freq: Four times a day (QID) | ORAL | Status: DC | PRN
  Administered 2015-10-21 – 2015-10-23 (×6): 0.5 mg via ORAL
  Filled 2015-10-20 (×4): qty 1

## 2015-10-20 MED ORDER — SODIUM CHLORIDE 0.9 % IJ SOLN
3.0000 mL | INTRAMUSCULAR | Status: DC | PRN
  Filled 2015-10-20: qty 3

## 2015-10-20 MED ORDER — LORAZEPAM 1 MG PO TABS: 1.0000 mg | ORAL_TABLET | ORAL | Status: DC | PRN

## 2015-10-20 MED ORDER — ENSURE ENLIVE PO LIQD
237.0000 mL | Freq: Three times a day (TID) | ORAL | Status: DC
  Administered 2015-10-20 – 2015-10-28 (×20): 237 mL via ORAL
  Filled 2015-10-20 (×2): qty 237

## 2015-10-20 MED ORDER — OLANZAPINE 5 MG PO TBDP
5.0000 mg | ORAL_TABLET | Freq: Three times a day (TID) | ORAL | Status: DC | PRN
  Administered 2015-10-20: 5 mg via ORAL
  Filled 2015-10-20 (×2): qty 1

## 2015-10-20 MED ORDER — DIPHENHYDRAMINE HCL 25 MG PO CAPS
50.0000 mg | ORAL_CAPSULE | Freq: Four times a day (QID) | ORAL | Status: DC | PRN
Start: 2015-10-20 — End: 2015-10-20

## 2015-10-20 MED ORDER — BENZTROPINE MESYLATE 1 MG/ML IJ SOLN
1.0000 mg | Freq: Once | INTRAMUSCULAR | Status: AC
  Administered 2015-10-20: 1 mg via INTRAMUSCULAR
  Filled 2015-10-20: qty 1

## 2015-10-20 MED ORDER — LORAZEPAM 2 MG/ML IJ SOLN
1.0000 mg | Freq: Once | INTRAMUSCULAR | Status: AC
  Administered 2015-10-20: 1 mg via INTRAVENOUS
  Filled 2015-10-20: qty 1

## 2015-10-20 MED ORDER — LORAZEPAM 1 MG PO TABS
1.0000 mg | ORAL_TABLET | Freq: Four times a day (QID) | ORAL | Status: DC | PRN
  Administered 2015-10-20 – 2015-10-26 (×12): 1 mg via ORAL
  Filled 2015-10-20 (×4): qty 1
  Filled 2015-10-20: qty 2
  Filled 2015-10-20 (×7): qty 1

## 2015-10-20 MED ORDER — SODIUM CHLORIDE 0.9 % IJ SOLN
3.0000 mL | Freq: Two times a day (BID) | INTRAMUSCULAR | Status: DC
  Filled 2015-10-20 (×2): qty 3

## 2015-10-20 MED ORDER — HALOPERIDOL LACTATE 5 MG/ML IJ SOLN: 10.0000 mg | Freq: Four times a day (QID) | INTRAMUSCULAR | Status: DC | PRN

## 2015-10-20 MED ORDER — HALOPERIDOL 5 MG PO TABS
5.0000 mg | ORAL_TABLET | Freq: Four times a day (QID) | ORAL | Status: DC | PRN
  Administered 2015-10-21 – 2015-10-23 (×6): 5 mg via ORAL
  Filled 2015-10-20 (×6): qty 1

## 2015-10-20 MED ORDER — LORAZEPAM 2 MG/ML IJ SOLN
2.0000 mg | INTRAMUSCULAR | Status: DC | PRN
  Administered 2015-10-20: 3 mg via INTRAVENOUS
  Filled 2015-10-20: qty 2

## 2015-10-20 MED ORDER — LORAZEPAM 2 MG/ML IJ SOLN: 1.0000 mg | Freq: Four times a day (QID) | INTRAMUSCULAR | Status: DC | PRN

## 2015-10-20 MED ORDER — BENZTROPINE MESYLATE 1 MG/ML IJ SOLN: 0.5000 mg | Freq: Four times a day (QID) | INTRAMUSCULAR | Status: DC | PRN

## 2015-10-20 MED ORDER — CLONIDINE HCL 0.1 MG PO TABS
0.1000 mg | ORAL_TABLET | Freq: Two times a day (BID) | ORAL | Status: DC
  Administered 2015-10-20 – 2015-10-28 (×16): 0.1 mg via ORAL
  Filled 2015-10-20 (×2): qty 1
  Filled 2015-10-20: qty 14
  Filled 2015-10-20 (×2): qty 1
  Filled 2015-10-20: qty 14
  Filled 2015-10-20 (×17): qty 1

## 2015-10-20 MED ORDER — DIPHENHYDRAMINE HCL 50 MG/ML IJ SOLN: 50.0000 mg | Freq: Four times a day (QID) | INTRAMUSCULAR | Status: DC | PRN

## 2015-10-20 MED ORDER — LORAZEPAM 1 MG PO TABS: 1.0000 mg | ORAL_TABLET | Freq: Four times a day (QID) | ORAL | Status: DC | PRN

## 2015-10-20 MED ORDER — ASPIRIN 81 MG PO CHEW
324.0000 mg | CHEWABLE_TABLET | Freq: Once | ORAL | Status: AC
  Administered 2015-10-20: 324 mg via ORAL
  Filled 2015-10-20: qty 4

## 2015-10-20 MED ORDER — ZIPRASIDONE MESYLATE 20 MG IM SOLR: 20.0000 mg | INTRAMUSCULAR | Status: DC | PRN

## 2015-10-20 MED ORDER — HALOPERIDOL LACTATE 5 MG/ML IJ SOLN: 5.0000 mg | Freq: Four times a day (QID) | INTRAMUSCULAR | Status: DC | PRN

## 2015-10-20 MED ORDER — HALOPERIDOL 5 MG PO TABS: 5.0000 mg | ORAL_TABLET | Freq: Four times a day (QID) | ORAL | Status: DC | PRN

## 2015-10-20 NOTE — [facility] Suicide Risk Assessment (Signed)
[facility]   Nursing information obtained from:    Demographic factors:    Current Mental Status:    Loss Factors:    Historical Factors:    Risk Reduction Factors:    Total Time spent with patient: 30 minutes Principal Problem: Schizophrenia (HCC) Diagnosis:   Patient Active Problem List   Diagnosis Date Noted  . Altered mental status [R41.82] 10/20/2015  . Chest pain [R07.9] 10/20/2015  . Acute delirium [R41.0] 10/20/2015  . Schizophrenia (HCC) [F20.9] 10/20/2015  . Syncope [R55] 09/18/2015  . Anxiety state [F41.1] 09/18/2015     Continued Clinical Symptoms:  Alcohol Use Disorder Identification Test Final Score (AUDIT): 0 The "Alcohol Use Disorders Identification Test", Guidelines for Use in Primary Care, Second Edition.  World Science writerHealth Organization [facility](WHO). Score between 0-7:  no or low risk or alcohol related problems. Score between 8-15:  moderate risk of alcohol related problems. Score between 16-19:  high risk of alcohol related problems. Score 20 or above:  warrants further diagnostic evaluation for alcohol dependence and treatment.   CLINICAL FACTORS:   Schizophrenia:   Paranoid or undifferentiated type Previous Psychiatric Diagnoses and Treatments    Psychiatric Specialty Exam: Physical Exam  Review of Systems  Unable to perform ROS: mental acuity    Blood pressure 112/69, pulse 79, temperature 99.2 F (37.3 C), temperature source Rectal, resp. rate 16, height 5\' 6"  (1.676 m), weight 84.823 kg (187 lb), SpO2 98 %.Body mass index is 30.2 kg/(m^2).              Please see H&P.                                            COGNITIVE FEATURES THAT CONTRIBUTE TO RISK:  Closed-mindedness, Polarized thinking and Thought constriction (tunnel vision)    SUICIDE RISK:   Patient at this time is not cooperative with evaluation. However per collateral as well as information obtained from chart - pt with mild acute suicide  risk.  PLAN OF CARE:Please see H&P.   Medical Decision Making:  Review of Psycho-Social Stressors (1), Review or order clinical lab tests (1), Decision to obtain old records (1), Review and summation of old records (2), Established Problem, Worsening (2), Review of Last Therapy Session (1), Review or order medicine tests (1), Review of Medication Regimen & Side Effects (2) and Review of New Medication or Change in Dosage (2)  I certify that inpatient services furnished can reasonably be expected to improve the patient's condition.   Shelbi Vaccaro MD 10/20/2015, 2:43 PM

## 2015-10-20 NOTE — ED Notes (Signed)
MD at bedside. 

## 2015-10-20 NOTE — ED Notes (Signed)
Pt states RUE hurts "where you broke it".   Pt indicated IV site.

## 2015-10-20 NOTE — Progress Notes (Signed)
Report called to [facility] Charge nurse.

## 2015-10-20 NOTE — Significant Event (Cosign Needed)
Notified by nursing staff patient became diaphoretic andy symptomatic of right sided non exertional CP after using the restroom. The patient had received Zyprexa an hour prior. The patient is denying any near syncope, SOB, N/V, muscled tremors or rigidity.  V/SL: Pulse HR tachyarrhythmia 115's, BP DBP > 110 Integument: clammy diaphoretic HEEENT: PERRLA bilat Pulm: CTA Cardio: audible S1,S2 without M/G/R Neuro: A & O x 3 spheres, CN 2 - 12 grossly intact  A/P Chest pains with diaphoresis r/o ACS, consider EPS Asa 81 mg x 4 given PO O2 via [place] @ 2l Called EMS to [facility] for further evaluation Cogentin 1 MG IM given      Attending Eappen MD

## 2015-10-20 NOTE — ED Provider Notes (Addendum)
CSN: [id number]     Arrival date & time 10/20/15  [id number] History   First MD Initiated Contact with Patient 10/20/15 [phone]     No chief complaint on file.    (Consider location/radiation/quality/duration/timing/severity/associated sxs/prior Treatment) HPI Level V caveat patient does not answer questions. History is obtained from EMS and from records accompany patient, and from and from Freddi StarrAshety Strader ,RN  [facility] via phoneat Psychiatric disorder. He complained of right-sided chest pain at approximate 620 this morning blood pressure was noted to be 183/105 with pulse of 114. He was treated with Zyprexa 5 mg orally at 6:20 AM aspirin 81 mg at 6:45 AM and Cogentin 1 mg IM at 6:45 AM Patient [facility] EMS reports that patient complained of right-sided chest pain this morning. He received no treatment prior to coming here. He presently complains of diffuse headache to me. He complained of abdominal pain to the nurse in the ED. He denies chest pain presently. He states he's hungry presently. Past Medical History  Diagnosis Date  . Anxiety   . Panic attacks   . Depression   . Schizophrenia (HCC)    History reviewed. No pertinent past surgical history. Family History  Problem Relation Age of Onset  . Diabetes Father    Social History  Substance Use Topics  . Smoking status: Never Smoker   . Smokeless tobacco: None  . Alcohol Use: No    Review of Systems  Unable to perform ROS: Psychiatric disorder      Allergies  Review of patient's allergies indicates no known allergies.  Home Medications   Prior to Admission medications   Medication Sig Start Date End Date Taking? Authorizing Provider  benztropine (COGENTIN) 0.5 MG tablet Take 1 tablet (0.5 mg total) by mouth 2 (two) times daily. 09/19/15   Costin Otelia SergeantM Gherghe, MD  busPIRone (BUSPAR) 10 MG tablet Take 10 mg by mouth 2 (two) times daily.    Historical Provider, MD  clonazePAM (KLONOPIN) 0.5 MG tablet  Take 1 tablet (0.5 mg total) by mouth 2 (two) times daily as needed (anxiety). Patient not taking: Reported on 10/16/2015 09/19/15   Leatha Gildingostin M Gherghe, MD  haloperidol decanoate (HALDOL DECANOATE) 50 MG/ML injection Inject 100 mg into the muscle every 28 (twenty-eight) days.    Historical Provider, MD  hydrOXYzine (ATARAX/VISTARIL) 25 MG tablet Take 1 tablet (25 mg total) by mouth 3 (three) times daily. 10/16/15   Rolland PorterMark James, MD   BP 171/90 mmHg  Pulse 98  Temp(Src) 98.5 F (36.9 C) (Oral)  Resp 20  Ht 5\' 6"  (1.676 m)  Wt 187 lb (84.823 kg)  BMI 30.20 kg/m2  SpO2 100% Physical Exam  HENT:  Head: Normocephalic and atraumatic.  Eyes: Conjunctivae and EOM are normal. Pupils are equal, round, and reactive to light.  Neck: Neck supple. No tracheal deviation present. No thyromegaly present.  Cardiovascular: Normal rate and regular rhythm.   No murmur heard. Pulmonary/Chest: Effort normal and breath sounds normal.  Abdominal: Soft. Bowel sounds are normal. He exhibits no distension. There is no tenderness.  Musculoskeletal: Normal range of motion. He exhibits no edema or tenderness.  Neurological: He is alert. Coordination normal.  Does not answer all questions, moves all extremities, does not follow simple commands. Speaks in nonsensical terms  Skin: Skin is warm and dry. No rash noted.  Psychiatric: He has a normal mood and affect.  Nursing note and vitals reviewed.   ED Course  Procedures (including critical care  time) Labs Review Labs Reviewed  GLUCOSE, CAPILLARY - Abnormal; Notable for the following:    Glucose-Capillary 107 (*)    All other components within normal limits    Imaging Review Ct Head Wo Contrast  10/19/2015  CLINICAL DATA:  Altered mental status.  Diaphoresis. EXAM: CT HEAD WITHOUT CONTRAST TECHNIQUE: Contiguous axial images were obtained from the base of the skull through the vertex without intravenous contrast. COMPARISON:  None. FINDINGS: There is no  intracranial hemorrhage, mass or evidence of acute infarction. There is mild generalized atrophy. There is mild chronic microvascular ischemic change. There is no significant extra-axial fluid collection. No acute intracranial findings are evident. The visible paranasal sinuses are clear. Soft tissue in the external auditory canals bilaterally likely represents cerumen. IMPRESSION: Mild generalized atrophy and minimal chronic white matter changes. No acute intracranial findings. Electronically Signed   By: Ellery Plunk M.D.   On: 10/19/2015 07:01   Dg Chest Portable 1 View  10/19/2015  CLINICAL DATA:  Tachypnea.  Midchest pain. EXAM: PORTABLE CHEST 1 VIEW COMPARISON:  09/17/2015 FINDINGS: There is a shallow inspiration with mild crowding of the basilar markings. No confluent airspace consolidation. No large effusion. Pulmonary vasculature is normal. IMPRESSION: No active disease. Electronically Signed   By: Ellery Plunk M.D.   On: 10/19/2015 06:39   I have personally reviewed and evaluated these images and lab results as part of my medical decision-making.   EKG Interpretation   Date/Time:  Wednesday October 20 2015 07:27:09 EDT Ventricular Rate:  102 PR Interval:  133 QRS Duration: 100 QT Interval:  347 QTC Calculation: 452 R Axis:   13 Text Interpretation:  Sinus tachycardia ST elev, probable normal early  repol pattern No significant change since last tracing Confirmed by  Briscoe Daniello  MD, Gesenia Bantz [phone]) on 10/20/2015 7:32:35 AM     AM patient is sitting on the floor, diaphoretic, speaking in nonsensical terms CIWA score calculated at 37  Patient pulled out his IV. He was administered Ativan milligrams IM.  10:20 AM patient is sleeping comfortably. Results for orders placed or performed during the hospital encounter of 10/19/15  Glucose, capillary  Result Value Ref Range   Glucose-Capillary 107 (H) 65 - 99 mg/dL  CBC with Differential/Platelet  Result Value Ref Range    WBC 9.2 4.0 - 10.5 K/uL   RBC 3.93 (L) 4.22 - 5.81 MIL/uL   Hemoglobin 12.0 (L) 13.0 - 17.0 g/dL   HCT 19.1 (L) 47.8 - 29.5 %   MCV 90.1 78.0 - 100.0 fL   MCH 30.5 26.0 - 34.0 pg   MCHC 33.9 30.0 - 36.0 g/dL   RDW 62.1 30.8 - 65.7 %   Platelets 253 150 - 400 K/uL   Neutrophils Relative % 79 %   Neutro Abs 7.3 1.7 - 7.7 K/uL   Lymphocytes Relative 11 %   Lymphs Abs 1.0 0.7 - 4.0 K/uL   Monocytes Relative 10 %   Monocytes Absolute 0.9 0.1 - 1.0 K/uL   Eosinophils Relative 0 %   Eosinophils Absolute 0.0 0.0 - 0.7 K/uL   Basophils Relative 0 %   Basophils Absolute 0.0 0.0 - 0.1 K/uL  Ammonia  Result Value Ref Range   Ammonia 36 (H) 9 - 35 umol/L  I-stat chem 8, ed  Result Value Ref Range   Sodium 137 135 - 145 mmol/L   Potassium 4.3 3.5 - 5.1 mmol/L   Chloride 104 101 - 111 mmol/L   BUN 12 6 -  20 mg/dL   Creatinine, Ser 1.61 0.61 - 1.24 mg/dL   Glucose, Bld 096 (H) 65 - 99 mg/dL   Calcium, Ion 0.45 4.09 - 1.23 mmol/L   TCO2 23 0 - 100 mmol/L   Hemoglobin 12.6 (L) 13.0 - 17.0 g/dL   HCT 81.1 (L) 91.4 - 78.2 %  I-stat troponin, ED  Result Value Ref Range   Troponin i, poc 0.02 0.00 - 0.08 ng/mL   Comment 3           Ct Head Wo Contrast  10/19/2015  CLINICAL DATA:  Altered mental status.  Diaphoresis. EXAM: CT HEAD WITHOUT CONTRAST TECHNIQUE: Contiguous axial images were obtained from the base of the skull through the vertex without intravenous contrast. COMPARISON:  None. FINDINGS: There is no intracranial hemorrhage, mass or evidence of acute infarction. There is mild generalized atrophy. There is mild chronic microvascular ischemic change. There is no significant extra-axial fluid collection. No acute intracranial findings are evident. The visible paranasal sinuses are clear. Soft tissue in the external auditory canals bilaterally likely represents cerumen. IMPRESSION: Mild generalized atrophy and minimal chronic white matter changes. No acute intracranial findings. Electronically  Signed   By: Ellery Plunk M.D.   On: 10/19/2015 07:01   Dg Chest Portable 1 View  10/19/2015  CLINICAL DATA:  Tachypnea.  Midchest pain. EXAM: PORTABLE CHEST 1 VIEW COMPARISON:  09/17/2015 FINDINGS: There is a shallow inspiration with mild crowding of the basilar markings. No confluent airspace consolidation. No large effusion. Pulmonary vasculature is normal. IMPRESSION: No active disease. Electronically Signed   By: Ellery Plunk M.D.   On: 10/19/2015 06:39    MDM  Patient had reportedly done well with benzodiazepines. He may be withdrawing from alcohol or drugs. CIWA protocol ordered. Patient has an acute delirium. I believe he may be suffering from alcohol withdrawal He did mention to one of the nurses that he drinks whiteowl wine. I spoke with Dr Konrad Dolores who will arrange for patient to be admitted to stepdown unit  Final diagnoses:  None   Dx  Acute delerium CRITICAL CARE Performed by: Doug Sou Total critical care time: 35 minutes Critical care time was exclusive of separately billable procedures and treating other patients. Critical care was necessary to treat or prevent imminent or life-threatening deterioration. Critical care was time spent personally by me on the following activities: development of treatment plan with patient and/or surrogate as well as nursing, discussions with consultants, evaluation of patient's response to treatment, examination of patient, obtaining history from patient or surrogate, ordering and performing treatments and interventions, ordering and review of laboratory studies, ordering and review of radiographic studies, pulse oximetry and re-evaluation of patient's condition.    Doug Sou, MD 10/20/15 1022  Addendum psychiatry subsequently evaluated patient in the emergency department and in conjunction with hospitalist arrange for patient to be transferred back to the [facility]  Doug Sou, MD 10/20/15 1107

## 2015-10-20 NOTE — Progress Notes (Signed)
1:1 note  1555: Pt disoriented to place, time, situation. Oriented to person "I'm Kyvon" Pt reports it is "the year of the macarena"pt [place] and that he is "49 years old" Speech garbled, difficult to understand. Pt denies alcohol and drug use. Pt garbled broken sentences talking about "white owl" and boiling "lemons and limes with salt" Pt reports "I can't tell you the recipe."  Assigned nursing staff at bedside. Tremors observed and felt. Vital signs obtained., MD notified of altered mental status. Pt compliant with PO medication regimen.   1630: Provided with meal sandwich tray, observed eating without difficulty.   1700: This nurse provided pt with urine specimen cup, pt took cup to toilet and filled up with toilet water, new specimen cup provided. Pt able to state correct age "I am [age] " Speech remains garbeled

## 2015-10-20 NOTE — BH Assessment (Deleted)
1:1 Initiation note: [facility]  He was very sedated and unable to walk on his own.  He was confused and drooling in the wheel chair.  He required assistance to transfer to bed.  BP 151/87 with pulse of 76.  Per report from Adam English nurse, Adam English had 3mg  of Ativan IM and 1mg  Ativan IV for CIWA 36.  He is high fall risk and talked with the doctor about concerns.  1:1 ordered and initiated for safety.  Sitter by side.

## 2015-10-20 NOTE — ED Notes (Signed)
Beginning around 0755 pt became increasingly agitated, removed IV, refused to stay in bed.  Pt had one leg and one arm over bed rail trying to get out of bed.  This RN became concerned about possibility of a fall and with the sitter's assistance helped pt out bed safely.  With sitter and this RN at bedside pt removed gown and all monitoring equipment and lowered himself onto floor on to hands and knees, then lying flat on floor.  Pt pulled hairs out of own head and pushed them under the door while muttering. This RN encouraged pt to get back in bed and with the sitter's assistance attempted to get pt up off floor but without success.  MD made aware of situation. Pt determined to be safer with mattress on the floor and bed removed.  Pt noted to be diaphoretic, reporting headache and nausea.  Security called to be on standby.  Pt trying to harm self by running into walls and equipment.  All removable equipment taken out of room to provide safe environment.  Charge RN consulted, recommended CIWA.  CIWA done with score of 36.  Security at bedside with pt responding well to verbal cues to remain calm on mattress at this time.

## 2015-10-20 NOTE — H&P (Signed)
Psychiatric Admission Assessment Adult  Patient Identification: Adam English MRN:  [id number] Date of Evaluation:  10/20/2015 Chief Complaint:Pt mumbles , unable to understand his speech.   Principal Diagnosis: Schizophrenia (HCC)   Diagnosis:   Patient Active Problem List   Diagnosis Date Noted  . Altered mental status [R41.82] 10/20/2015  . Chest pain [R07.9] 10/20/2015  . Acute delirium [R41.0] 10/20/2015  . Schizophrenia (HCC) [F20.9] 10/20/2015  . Syncope [R55] 09/18/2015  . Anxiety state [F41.1] 09/18/2015       History of Present Illness:: Adam English is a 76 y old AAM who is single , unemployed , lives with his mother in [place] , has a hx of schizophrenia , is on Haldol decanoate LAI ( last dose - 10/13/15) presented to the ED for disorganized behavior . Pt per review of EHR had several ED visits the past few weeks. Pt was admitted to medical floor 09/17/15- 09/19/15 for polydypsia , hyponatremia . Pt was seen in ED on 10/17 for hyponatremia , and was advised to restrict his fluid intake . Pt was again seen in ED on 10/29 for disorganized behavior and was discharged to follow up with his out patient provider. Pt again was brought back to the ED 10/18/15 for disorganized behavior . Pt was admitted to [facility] , however had to be send back to the ED for chest pain , diaphoresis as well as elevated BP.   Pt was seen in ED per initial notes as very confused , disoriented and agitated - pulling out IV lines and having AMS. Pt was seen by ED physician - as well as Family medicine consult team - who diagnosed patient with Acute delirium - Per consult note per Family medicine done on 10/20/15- "Likely secondary to underlying mental illness worsened by potential opioid or alcohol withdrawal. Also consider possible medication induced as patient received his Haldol DepoCyt on 10/13/2015.Marland Kitchen Patient with multiple vague complaints that seem to come and go including right arm pain, abdominal pain,  headache, sweating, and arm bleeding. At any one moment patient may have one complaint which will be completely resolved a few seconds later. Ativan given in ED with significant improvement. Patient's case discussed with Adam English at the health and then with Adam English. and the [facility] team here at [facility]. After review of chart they agree that patient's symptoms are likely all related to his psychological disorder and have agreed to take patient back to behavioral health. "   Patient seen and chart reviewed.Discussed patient with treatment team. Writer attempted to evaluate patient. Pt laying in bed , has sitter at bedside . Pt unable to open his eyes or cooperate with writer at this time. Writer unable to assess his mental status at this time , since pt appears to be sedated. Pt seen as mumbling - unable to understand his speech.  Collateral information was obtained from mother - Adam English - at [phone] - Per mother pt was diagnosed with mental illness - schizophrenia for the very first time in 2002. Pt at that time exhibited disorganized behavior , was hoarding things like food in his closet . Pt at that time was not admitted to a mental health facility. Per mother he was first hospitalized at [facility] - in September ( which as per above was a medical admission) and the second admission is the current one. Per mother pt follows up with [facility] and he is on monthly Haldol decanoate IM 100 mg since 2013. Pt received his last  IM on 10/13/15. Mother reports that pt recently started having the same spells as he had in September - when he appeared to take off his clothes and was anxious. Pt had reported to mother one night that he had seen a masked man. Per mother pt had recent medication changes at [facility] was increased to 10 mg tid for anxiety sx. Per mother pt has had no suicide attempts.  He does not abuse any illicit drugs or alcohol. He has not hx of medical issues - other than his most recent admissions  for hyponatremia . He has no hx of allergies to any medications. He has no hx of sexual of physical abuse. Pt is single, has no children. Pt went up to two years of college in [place] , [place] . Pt used to work in [employer] companies in the past - worked for 15 years . The last time he worked was with [employer] a year ago. Pt is usually complaint with medications - however - of note- ED notes states pt was not taking medications regularly due to its bad taste .        Associated Signs/Symptoms: Depression Symptoms:  As per collateral above (Hypo) Manic Symptoms:  See above Anxiety Symptoms: see above Psychotic Symptoms:  See above PTSD Symptoms: See above Total Time spent with patient: 1 hour  Past Psychiatric History: Per mother pt was hospitalized once in September ( per EHR it was for hyponatremia) and this is his second admission. Diagnosed with schizophrenia in 2002. Pt does not have any suicide attempts.  Risk to Self: Is patient at risk for suicide?: No Risk to Others:   Prior Inpatient Therapy:   Prior Outpatient Therapy:    Alcohol Screening: Patient refused Alcohol Screening Tool: Yes 1. How often do you have a drink containing alcohol?: Never 9. Have you or someone else been injured as a result of your drinking?: No 10. Has a relative or friend or a doctor or another health worker been concerned about your drinking or suggested you cut down?: No Alcohol Use Disorder Identification Test Final Score (AUDIT): 0 Brief Intervention: AUDIT score less than 7 or less-screening does not suggest unhealthy drinking-brief intervention not indicated Substance Abuse History in the last 12 months:  No. Consequences of Substance Abuse: Negative Previous Psychotropic Medications: Yes  Psychological Evaluations: No  Past Medical History:  Past Medical History  Diagnosis Date  . Anxiety   . Panic attacks   . Depression   . Schizophrenia (HCC)    History reviewed. No pertinent past  surgical history. Family History:  Family History  Problem Relation Age of Onset  . Diabetes Father    Family Psychiatric  History:Per mother one of patient's cousins have hx of depression.  Social History: Mother reports pt is single , unemployed , denies having any children. He went up to 2 years of college. He stays with his mother in [place]. History  Alcohol Use No     History  Drug Use No    Social History   Social History  . Marital Status: Single    Spouse Name: N/A  . Number of Children: N/A  . Years of Education: N/A   Social History Main Topics  . Smoking status: Never Smoker   . Smokeless tobacco: None  . Alcohol Use: No  . Drug Use: No  . Sexual Activity: Not Asked   Other Topics Concern  . None   Social History Narrative   Additional  Social History:    Pain Medications: None Prescriptions: Pt does not know what meds he is taking. Over the Counter: None History of alcohol / drug use?: No history of alcohol / drug abuse                    Allergies:  No Known Allergies Lab Results:  Results for orders placed or performed during the hospital encounter of 10/19/15 (from the past 48 hour(s))  Glucose, capillary     Status: Abnormal   Collection Time: 10/20/15  4:21 AM  Result Value Ref Range   Glucose-Capillary 107 (H) 65 - 99 mg/dL  Ammonia     Status: Abnormal   Collection Time: 10/20/15  7:00 AM  Result Value Ref Range   Ammonia 36 (H) 9 - 35 umol/L  CBC with Differential/Platelet     Status: Abnormal   Collection Time: 10/20/15  7:30 AM  Result Value Ref Range   WBC 9.2 4.0 - 10.5 K/uL   RBC 3.93 (L) 4.22 - 5.81 MIL/uL   Hemoglobin 12.0 (L) 13.0 - 17.0 g/dL   HCT 16.135.4 (L) 09.639.0 - 04.552.0 %   MCV 90.1 78.0 - 100.0 fL   MCH 30.5 26.0 - 34.0 pg   MCHC 33.9 30.0 - 36.0 g/dL   RDW 40.913.5 81.111.5 - 91.415.5 %   Platelets 253 150 - 400 K/uL   Neutrophils Relative % 79 %   Neutro Abs 7.3 1.7 - 7.7 K/uL   Lymphocytes Relative 11 %   Lymphs Abs 1.0 0.7 -  4.0 K/uL   Monocytes Relative 10 %   Monocytes Absolute 0.9 0.1 - 1.0 K/uL   Eosinophils Relative 0 %   Eosinophils Absolute 0.0 0.0 - 0.7 K/uL   Basophils Relative 0 %   Basophils Absolute 0.0 0.0 - 0.1 K/uL  I-stat troponin, ED     Status: None   Collection Time: 10/20/15  7:50 AM  Result Value Ref Range   Troponin i, poc 0.02 0.00 - 0.08 ng/mL   Comment 3            Comment: Due to the release kinetics of cTnI, a negative result within the first hours of the onset of symptoms does not rule out myocardial infarction with certainty. If myocardial infarction is still suspected, repeat the test at appropriate intervals.   I-stat chem 8, ed     Status: Abnormal   Collection Time: 10/20/15  7:51 AM  Result Value Ref Range   Sodium 137 135 - 145 mmol/L   Potassium 4.3 3.5 - 5.1 mmol/L   Chloride 104 101 - 111 mmol/L   BUN 12 6 - 20 mg/dL   Creatinine, Ser 7.820.90 0.61 - 1.24 mg/dL   Glucose, Bld 956105 (H) 65 - 99 mg/dL   Calcium, Ion 2.131.17 0.861.12 - 1.23 mmol/L   TCO2 23 0 - 100 mmol/L   Hemoglobin 12.6 (L) 13.0 - 17.0 g/dL   HCT 57.837.0 (L) 46.939.0 - 62.952.0 %    Metabolic Disorder Labs:  Lab Results  Component Value Date   HGBA1C 5.7* 09/18/2015   MPG 117 09/18/2015   No results found for: PROLACTIN No results found for: CHOL, TRIG, HDL, CHOLHDL, VLDL, LDLCALC  Current Medications: Current Facility-Administered Medications  Medication Dose Route Frequency Provider Last Rate Last Dose  . acetaminophen (TYLENOL) tablet 650 mg  650 mg Oral Q6H PRN Adam HoughSpencer E Simon, PA-C      . alum & mag hydroxide-simeth (MAALOX/MYLANTA) 200-200-20 MG/5ML  suspension 30 mL  30 mL Oral Q4H PRN Adam Hough, PA-C      . benztropine (COGENTIN) tablet 0.5 mg  0.5 mg Oral Q6H PRN Adam Longs, MD       Or  . benztropine mesylate (COGENTIN) injection 0.5 mg  0.5 mg Intramuscular Q6H PRN Adam Creps, MD      . haloperidol (HALDOL) tablet 5 mg  5 mg Oral Q6H PRN Adam Longs, MD       Or  . haloperidol  lactate (HALDOL) injection 10 mg  10 mg Intramuscular Q6H PRN Adam Ollis, MD      . LORazepam (ATIVAN) tablet 1 mg  1 mg Oral Q6H PRN Adam Longs, MD       Or  . LORazepam (ATIVAN) injection 1 mg  1 mg Intramuscular Q6H PRN Adam Stfort, MD      . magnesium hydroxide (MILK OF MAGNESIA) suspension 30 mL  30 mL Oral Daily PRN Adam Hough, PA-C       PTA Medications: Prescriptions prior to admission  Medication Sig Dispense Refill Last Dose  . benztropine (COGENTIN) 0.5 MG tablet Take 1 tablet (0.5 mg total) by mouth 2 (two) times daily. 60 tablet 1 unknown  . busPIRone (BUSPAR) 10 MG tablet Take 10 mg by mouth 2 (two) times daily.   unknown  . clonazePAM (KLONOPIN) 0.5 MG tablet Take 1 tablet (0.5 mg total) by mouth 2 (two) times daily as needed (anxiety). (Patient not taking: Reported on 10/16/2015) 30 tablet 0 unknown  . haloperidol decanoate (HALDOL DECANOATE) 50 MG/ML injection Inject 100 mg into the muscle every 28 (twenty-eight) days.   10/15/2015  . hydrOXYzine (ATARAX/VISTARIL) 25 MG tablet Take 1 tablet (25 mg total) by mouth 3 (three) times daily. 30 tablet 0 unknown    Musculoskeletal: Strength & Muscle Tone: within normal limits Gait & Station: Unable to assess Patient leans: N/A  Psychiatric Specialty Exam: Physical Exam  Constitutional:  Pt was medically cleared per ED.    Review of Systems  Unable to perform ROS: mental acuity    Blood pressure 112/69, pulse 79, temperature 99.2 F (37.3 C), temperature source Rectal, resp. rate 16, height  (1.676 m), weight 84.823 kg (187 lb), SpO2 98 %.Body mass index is 30.2 kg/(m^2).  General Appearance: Disheveled  Eye Solicitor::  None  Speech:  mumbling  Volume:  Decreased  Mood:  unable to assess  Affect:  Constricted  Thought Process:  Unable to assess  Orientation:  Other:  pt reports he is at [employer] Content:  unable to assess  Suicidal Thoughts:  did not express any  Homicidal Thoughts:  did not  express any  Memory:  unable to assess  Judgement:  Poor  Insight:  Lacking  Psychomotor Activity:  Decreased  Concentration:  unable to assess  Recall:  unable to assess  Fund of Knowledge:unable to assess  Language: Poor  Akathisia:  unable to assess    AIMS (if indicated):     Assets:  Others:  access to health care  ADL's:  Impaired  Cognition: Impaired,  Moderate  Sleep:        Assessment /Treatment Plan Summary: Daily contact with patient to assess and evaluate symptoms and progress in treatment and Medication management    Pt was seen in ED per initial notes as very confused , disoriented and agitated - pulling out IV lines and having AMS. Pt was seen by ED physician - as well as Family  medicine consult team - who diagnosed patient with Acute delirium - Per consult note per Family medicine done on 10/20/15-pt cleared per consult team as well as Adam English and Adam English team and was transferred to [facility] for further management. Patient will benefit from inpatient treatment and stabilization.  Estimated length of stay is 5-7 days.  Reviewed past medical records,treatment plan.  Collateral obtained from mother Adam English.-see above Will start patient on Haldol 5 mg PO/IM prn for severe agitation along with Ativan 1 mg PO/IM q6h prn . Will continue to monitor vitals ,medication compliance and treatment side effects while patient is here.  Will monitor for medical issues as well as call consult as needed.  Will continue 1:1 precaution for safety.  Will monitor VS q1h .  Place pt on Falls precaution. Reviewed labs ,CBC , cmp -wnl , UDS - negative for amphetamines , cocaine, bzd, opioids, barbiturates, THC , BAL <5 , EKG- WNL , CT scan head - wnl . will order vitamin b12, rpr ,folate ,UA . Also reviewed TSH- wnl ( 09/18/15) , Hba1c- (09/18/15) - 5.9 CSW will start working on disposition.  Patient to participate in therapeutic milieu .         Observation Level/Precautions:  15  minute checks    Psychotherapy:  Individual and group therapy     Consultations:  Social worker  Discharge Concerns:  Stability/safety       I certify that inpatient services furnished can reasonably be expected to improve the patient's condition.   Tyquan Carmickle MD 11/2/20163:26 PM

## 2015-10-20 NOTE — Progress Notes (Signed)
LCSW reviewed case with MD and Psychiatrist Dr. Shela CommonsJ Patient medically cleared and [facility]. Spoke with Rosey BathAC Kelly about patient returning.  Patient going back to original bed 504-1 to Dr. Elna BreslowEappen Report number:  [phone]:  [phone]   [facility].  Voluntary admission. No need for paperwork to be completed as this was done on 10/20/15  Deretha EmoryHannah Elden Brucato LCSW, MSW Clinical Social Work: Emergency Room [phone]

## 2015-10-20 NOTE — Consult Note (Addendum)
[facility] Consult Note  Adam BladeStanley English MVH:[id number] DOB: 12-Apr-1966 DOA: 10/19/2015  Referring physician: Dr. Ethelda ChickJacubowitz - [facility] PCP: No PCP Per Patient   Chief Complaint: vague complaints  HPI: Adam English is a 49 y.o. male  Low 5 caveat: Patient presenting in altered mental state and unable to give reliable history. History provided by EDP, behavioral health notes, and by patient on a limited basis.  Pt presenting from Behavioral health, with complaint of chest pain and appreciable diaphoresis per the nursing staff. Early this morning patient pointed chest pain and EMS was called to evaluate patient. [facility]. Chest pain was described as right-sided. No medications were given and by the time the [facility] department he had no complaints of chest pain but did complain of a headache and abdominal pain however patient also states that he was hungry and was requesting food. At the time of my evaluation patient had no complaint of abdominal pain or headache was complaining of right arm pain where his previous IV had been placed and states that his arm is bleeding out and broken. Patient also requesting to be left alone     Review of Systems:  Unable to obtain further ROS due to AMS  Past Medical History  Diagnosis Date  . Anxiety   . Panic attacks   . Depression   . Schizophrenia (HCC)    History reviewed. No pertinent past surgical history. Social History:  reports that he has never smoked. He does not have any smokeless tobacco history on file. He reports that he does not drink alcohol or use illicit drugs.  No Known Allergies  Family History  Problem Relation Age of Onset  . Diabetes Father      Prior to Admission medications   Medication Sig Start Date End Date Taking? Authorizing Provider  benztropine (COGENTIN) 0.5 MG tablet Take 1 tablet (0.5 mg total) by mouth 2 (two) times daily. 09/19/15    Adam Otelia SergeantM Gherghe, MD  busPIRone (BUSPAR) 10 MG tablet Take 10 mg by mouth 2 (two) times daily.    Historical Provider, MD  clonazePAM (KLONOPIN) 0.5 MG tablet Take 1 tablet (0.5 mg total) by mouth 2 (two) times daily as needed (anxiety). Patient not taking: Reported on 10/16/2015 09/19/15   Leatha Gildingostin M Gherghe, MD  haloperidol decanoate (HALDOL DECANOATE) 50 MG/ML injection Inject 100 mg into the muscle every 28 (twenty-eight) days.    Historical Provider, MD  hydrOXYzine (ATARAX/VISTARIL) 25 MG tablet Take 1 tablet (25 mg total) by mouth 3 (three) times daily. 10/16/15   Rolland PorterMark James, MD   Physical Exam: Filed Vitals:   10/20/15 [id number] 10/20/15 0756 10/20/15 0827 10/20/15 0928  BP:   158/91   Pulse: 100  100   Temp:  99.2 F (37.3 C)    TempSrc:  Rectal    Resp: 16     Height:      Weight:      SpO2: 100%   100%    Wt Readings from Last 3 Encounters:  10/19/15 84.823 kg (187 lb)  10/18/15 84.171 kg (185 lb 9 oz)  10/04/15 90.266 kg (199 lb)    General: Sleeping but arousable. Somewhat agitated, and only minimally complies with exam Eyes: EOMI, normal lids, iris ENT:  grossly normal hearing, lips & tongue Neck: Limited due to pts non compliance with exam Cardiovascular: RRR, no m/r/g. No LE edema.  Respiratory:  CTA bilaterally, no w/r/r. Normal respiratory effort.  Abdomen:  soft, ntnd Skin:  no rash or induration seen on limited exam, non-diaphoretic Musculoskeletal: grossly normal tone BUE/BLE Psychiatric: AO to person and place only. Answers some questions appropriately but others he answers in a nonsensical fashion. Neurologic: CN 2-12 grossly intact, moves all extremities in coordinated fashion.          Labs on Admission:  Basic Metabolic Panel:  Recent Labs Lab 10/18/15 2026 10/19/15 0604 10/20/15 0751  NA 132* 137 137  K 3.7 3.6 4.3  CL 99* 102 104  CO2 23  --   --   GLUCOSE 123* 122* 105*  BUN CREATININE 1.11 1.00 0.90  CALCIUM 9.4  --   --     Liver Function Tests:  Recent Labs Lab 10/18/15 2026  AST 60*  ALT 38  ALKPHOS 72  BILITOT 1.0  PROT 7.2  ALBUMIN 3.8   No results for input(s): LIPASE, AMYLASE in the last 168 hours.  Recent Labs Lab 10/20/15 0700  AMMONIA 36*   CBC:  Recent Labs Lab 10/18/15 2026 10/19/15 0604 10/20/15 0730 10/20/15 0751  WBC 10.5  --  9.2  --   NEUTROABS 8.1*  --  7.3  --   HGB 13.2 14.3 12.0* 12.6*  HCT 38.7* 42.0 35.4* 37.0*  MCV 89.6  --  90.1  --   PLT 286  --  253  --    Cardiac Enzymes: No results for input(s): CKTOTAL, CKMB, CKMBINDEX, TROPONINI in the last 168 hours.  BNP (last 3 results) No results for input(s): BNP in the last 8760 hours.  ProBNP (last 3 results) No results for input(s): PROBNP in the last 8760 hours.  CBG:  Recent Labs Lab 10/19/15 0547 10/20/15 0421  GLUCAP 125* 107*    Radiological Exams on Admission: Ct Head Wo Contrast  10/19/2015  CLINICAL DATA:  Altered mental status.  Diaphoresis. EXAM: CT HEAD WITHOUT CONTRAST TECHNIQUE: Contiguous axial images were obtained from the base of the skull through the vertex without intravenous contrast. COMPARISON:  None. FINDINGS: There is no intracranial hemorrhage, mass or evidence of acute infarction. There is mild generalized atrophy. There is mild chronic microvascular ischemic change. There is no significant extra-axial fluid collection. No acute intracranial findings are evident. The visible paranasal sinuses are clear. Soft tissue in the external auditory canals bilaterally likely represents cerumen. IMPRESSION: Mild generalized atrophy and minimal chronic white matter changes. No acute intracranial findings. Electronically Signed   By: Ellery Plunk M.D.   On: 10/19/2015 07:01   Dg Chest Portable 1 View  10/19/2015  CLINICAL DATA:  Tachypnea.  Midchest pain. EXAM: PORTABLE CHEST 1 VIEW COMPARISON:  09/17/2015 FINDINGS: There is a shallow inspiration with mild crowding of the basilar  markings. No confluent airspace consolidation. No large effusion. Pulmonary vasculature is normal. IMPRESSION: No active disease. Electronically Signed   By: Ellery Plunk M.D.   On: 10/19/2015 06:39     Assessment/Plan Active Problems:   Undifferentiated schizophrenia (HCC)   Altered mental status   Chest pain   Acute delirium  49 year old male with a history of anxiety, depression, schizophrenia, alcohol and drug use presenting from behavioral health with concerns for ACS and acute delirium.  Acute delirium: Likely secondary to underlying mental illness worsened by potential opioid or alcohol withdrawal. Also consider possible medication induced as patient received his Haldol DepoCyt on 10/13/2015.Marland Kitchen  Patient with multiple vague complaints that seem to come and go including right arm pain, abdominal pain, headache,  sweating, and arm bleeding. At any one moment patient may have one complaint which will be completely resolved a few seconds later. Ativan given in ED with significant improvement. Patient's case discussed with Tresa Endo at the health and then with Dr. Shela Commons. and the [facility] team here at [facility]. After review of chart they agree that patient's symptoms are likely all related to his psychological disorder and have agreed to take patient back to behavioral health. Transport was placed. Greatly appreciate the [facility] teams assistance in the care of this patient.  Chest pain: Patient complained of chest pain earlier this morning appropriate period time. No medications were given in chest pain resolved. Of note patient has had multiple EKGs recently as well as troponins since initial presentation to the hospital on 10/18/2015. WHICH of the negative. Today's EKG is also normal and his troponin is also negative. Patient's complaint of chest pain is unlikely to be related to ACS but likely psychosomatic in nature as he has multiple other vague complaints that come and go.    Code Status:  FULL Disposition Plan:  Transfer to [facility]   MERRELL, DAVID J, MD Family Medicine [facility] [email or web address] Password [email or web address]    >70 min in direct pt care and coordination

## 2015-10-20 NOTE — Progress Notes (Signed)
D: Pt reports having AH telling him that he is going to die. Pt reports having VH of the "[address]". Pt presents disorganized, delusional, and paranoid.Pt present tremulous at the beginning of the shift. Per report, pt was trem[facility]. PTA medications reordered with Klonopin given as well. Pt's UDS and BAL was negative.  Pt is a high fall risk due to multiple falls prior to admission. Pt is not compliant with keeping his armband on. Pt required constant redirection. Pt often will hide in the shower or remove all of his clothing.Per notes, pt was hiding in the closet at home. Pt mentioned the avoidance of "ISIS" to staff. Pt's tremors decreased as the shift progressed, but the constant need for redirection continued. BP elevated for vitals obtained at 0330, diaphoresis noted. Agitation protocol ordered for pt. Pt lying in bed upon next check. No medication from the agitation protocol administered at this time.  A: Writer administered scheduled and prn medications to pt, per MD orders. Continued support and availability as needed was extended to this pt. Staff continue to monitor pt with q8215min checks.  R: No adverse drug reactions noted. Pt receptive to treatment. Pt remains safe at this time.

## 2015-10-20 NOTE — Progress Notes (Signed)
1:1 Note  Adam English is disoriented and unsteady on his feet. Visible hand tremors observed when attempting to eat his meal. He states president is Ardyth HarpsRonald Reagan then corrects himself and says Obama. He is unaware of his location. Words are somewhat difficult to understand. He is able to state his full name and DOB correctly. He is endorsing feelings of someone touching his feet when he is lying in his bed. Will continue to monitor for safety. High risk for falls.

## 2015-10-20 NOTE — ED Notes (Signed)
1 mg ativan wasted with Chrislyn, RN at bedside.

## 2015-10-20 NOTE — Tx Team (Signed)
Interdisciplinary Treatment Plan Update (Adult)  Date:  10/20/2015 Time Reviewed:  3:12 PM  Progress in Treatment: Attending groups: Pt new to milieu, still assessing. Participating in groups: NA. Taking medication as prescribed:  Yes. Tolerating medication:  Yes. Family/Significant othe contact made:  Yes  Patient understands diagnosis:  Yes, as evidenced by seeking help with AVH. Discussing patient identified problems/goals with staff:  Yes, see initial care plan. Medical problems stabilized or resolved:  Yes Denies suicidal/homicidal ideation: Yes. Issues/concerns per patient self-inventory: No. Other:  New problem(s) identified:   Discharge Plan or Barriers: See below  Reason for Continuation of Hospitalization: Depression Hallucinations Medication stabilization Suicidal ideation  Comments: Adam English is a 83 y old AAM who is single , unemployed , lives with his mother in [place] , has a hx of schizophrenia , is on Haldol decanoate LAI ( last dose - 10/13/15) presented to the ED for disorganized behavior . Pt per review of EHR had several ED visits the past few weeks. Pt was admitted to medical floor 09/17/15- 09/19/15 for polydypsia , hyponatremia . Pt was seen in ED on 10/17 for hyponatremia , and was advised to restrict his fluid intake . Pt was again seen in ED on 10/29 for disorganized behavior and was discharged to follow up with his out patient provider. Pt again was brought back to the ED 10/18/15 for disorganized behavior . Pt was admitted to [facility] , however had to be send back to the ED for chest pain , diaphoresis as well as elevated BP. Pt was seen in ED per initial notes as very confused , disoriented and agitated - pulling out IV lines and having AMS. Pt was seen by ED physician - as well as Family medicine consult team - who diagnosed patient with Acute delirium likely 2/2 his recent Haldol decanoate IM. Pt refused injection prior to admission.   Haldol, Ativan, Zoloft  trial  Estimated length of stay: 4-5 days  New goal(s):  Review of initial/current patient goals per problem list:  1. Goal(s): Patient will participate in aftercare plan  Met:Yes  Target date: at discharge  As evidenced by: Patient will participate within aftercare plan AEB aftercare provider and housing plan at discharge being identified.  10/20/15: Return home, follow up [facility]  2. Goal (s): Patient will exhibit decreased depressive symptoms and suicidal ideations.  Met:No   Target date: at discharge  As evidenced by: Patient will utilize self rating of depression at 3 or below and demonstrate decreased signs of depression or be deemed stable for discharge by MD. 10/20/15: Pt endorses SI and has attempted to self-harm by running into objects on the hallway. CSW will continue to assess.  4. Goal(s): Patient will demonstrate decreased signs of psychosis.  Met: No  Target date:at discharge  As evidenced by: Patient will demonstrate decreased signs of psychosis as evidenced by a reduction in AVH, paranoia, and/or delusions.   10/20/15: Pt endorses AVH. Pt states that he hears voices that tell him he's going to die.  5. Goal(s): Patient will demonstrate decreased signs of withdrawal due to substance abuse   Met: No   Target date: 3-5 days post admission date   As evidenced by: Patient will produce a CIWA/COWS score of 0, have stable vitals signs, and no symptoms of withdrawal 10/20/15: Pt currently has a CIWA score of 36.    Attendees: Patient:  10/20/2015 3:12 PM  Family:   10/20/2015 3:12 PM  Physician:  Dr. Ursula Alert, MD 10/20/2015  3:12 PM  Nursing: Manuella Ghazi, RN   10/20/2015 3:12 PM  Case Manager:  Roque Lias, LCSW 10/20/2015 3:12 PM  Counselor:  Matthew Saras, MSW Intern 10/20/2015 3:12 PM  Other:   10/20/2015 3:12 PM  Other:   10/20/2015 3:12 PM  Other:   10/20/2015 3:12 PM  Other:  10/20/2015 3:12 PM  Other:    Other:    Other:     Other:    Other:    Other:      Scribe for Treatment Team:   Georga Kaufmann, MSW Intern 10/20/2015 3:12 PM

## 2015-10-20 NOTE — ED Notes (Addendum)
1 mg Ativan wasted with Margie EgeSarah SLack, RN, at bedside.

## 2015-10-20 NOTE — Progress Notes (Signed)
1:1 Initiation note: Adam English returned from [facility] ED.  He was very sedated and unable to walk on his own.  He was confused and drooling in the wheel chair.  He required assistance to transfer to bed.  BP 151/87 with pulse of 76.  Per report from [facility] ED nurse, [name] had 3mg of Ativan IM and 1mg Ativan IV for CIWA 36.  He is high fall risk and talked with the doctor about concerns.  1:1 ordered and initiated for safety.  Sitter by side.   

## 2015-10-20 NOTE — ED Notes (Signed)
[facility]o RUQ abdominal pain and CP, new onset per EMS 0600 today  EMS reports unable to give baseline mental function.  Pt appears withdrawn, eyes closed, flat affect.  Resp e/u, NAD noted at this time.  [facility] sitter present at bedside.

## 2015-10-21 LAB — URINALYSIS W MICROSCOPIC (NOT AT ARMC)
BILIRUBIN URINE: NEGATIVE
Glucose, UA: NEGATIVE mg/dL
HGB URINE DIPSTICK: NEGATIVE
KETONES UR: 15 mg/dL — AB
Leukocytes, UA: NEGATIVE
NITRITE: NEGATIVE
PH: 7 (ref 5.0–8.0)
Protein, ur: NEGATIVE mg/dL
SPECIFIC GRAVITY, URINE: 1.016 (ref 1.005–1.030)
UROBILINOGEN UA: 1 mg/dL (ref 0.0–1.0)

## 2015-10-21 LAB — FOLATE: FOLATE: 10.8 ng/mL (ref >5.9)

## 2015-10-21 LAB — RPR: RPR Ser Ql: NONREACTIVE

## 2015-10-21 LAB — VITAMIN B12: VITAMIN B 12: 211 pg/mL (ref 180–914)

## 2015-10-21 MED ORDER — SERTRALINE HCL 25 MG PO TABS
25.0000 mg | ORAL_TABLET | Freq: Every day | ORAL | Status: DC
  Administered 2015-10-21 – 2015-10-28 (×8): 25 mg via ORAL
  Filled 2015-10-21 (×9): qty 1
  Filled 2015-10-21: qty 7
  Filled 2015-10-21: qty 1

## 2015-10-21 MED ORDER — CYANOCOBALAMIN 500 MCG PO TABS
500.0000 ug | ORAL_TABLET | Freq: Every day | ORAL | Status: DC
  Administered 2015-10-21: 500 ug via ORAL
  Administered 2015-10-22: 1000 ug via ORAL
  Administered 2015-10-23 – 2015-10-28 (×6): 500 ug via ORAL
  Filled 2015-10-21 (×7): qty 1
  Filled 2015-10-21: qty 7
  Filled 2015-10-21 (×3): qty 1

## 2015-10-21 MED ORDER — AMANTADINE HCL 100 MG PO CAPS
100.0000 mg | ORAL_CAPSULE | Freq: Two times a day (BID) | ORAL | Status: DC
  Administered 2015-10-21 – 2015-10-25 (×9): 100 mg via ORAL
  Filled 2015-10-21 (×13): qty 1

## 2015-10-21 NOTE — Plan of Care (Signed)
Problem: Consults Goal: Depression Patient Education See Patient Education Module for education specifics.  Outcome: Progressing Nurse discussed depression/coping skills with patient.        

## 2015-10-21 NOTE — Progress Notes (Signed)
[facility] MD Progress Note  10/21/2015 11:30 AM Adam English  MRN:  [id number] Subjective: Patient states " I am fine. I was having anxiety attacks , I had it three times last month. I was watching TV and that is how it started . I was feeling hot and I asked my  Mother for something cold. I have these tremors , they have been going on for months now. I do not smoke or use any kind of illicit drugs . "  Objective:Adam English is a [age] y old AAM who is single , unemployed , [place] , has a hx of schizophrenia , is on Haldol decanoate LAI ( last dose - 10/13/15) presented to the ED for disorganized behavior . Pt per review of EHR had several ED visits the past few weeks. Pt was admitted to medical floor 09/17/15- 09/19/15 for polydipsia , hyponatremia . Pt was seen in ED on 10/17 for hyponatremia , and was advised to restrict his fluid intake . Pt was again seen in ED on 10/29 for disorganized behavior and was discharged to follow up with his out patient provider. Pt again was brought back to the ED 10/18/15 for disorganized behavior . [facility] , however had to be send back to the ED for chest pain , diaphoresis as well as elevated BP."  Patient seen and chart reviewed.Discussed patient with treatment team.  Pt today seen laying on his bed, sitter at bedside. Pt responded to writer's questions appropriately. Pt was able to sit up on his bed at writer's request. Pt is alert , oriented x4 and appears to be calm. Pt reports sleep as good last night. Pt was briefly able to talk about the events that led to his hospitalization. Pt reports that he was watching TV and suddenly had an anxiety attack . He reported that he was feeling hot and so he removed his clothes and asked his mom to bring him something cold to drink. Pt reports he was on Buspar , but dose was reduced due to him having tremors and anxiety sx. Pt reports having BL tremors since the past several months. Pt denies  SI/AH/VH/paranoia. Per staff - pt is compliant on medications , pt continues to need support , redirection and has sitter at bedside for safety reasons , fall risk .     Principal Problem: Schizophrenia (HCC) Diagnosis:   Patient Active Problem List   Diagnosis Date Noted  . Altered mental status [R41.82] 10/20/2015  . Chest pain [R07.9] 10/20/2015  . Acute delirium [R41.0] 10/20/2015  . Schizophrenia (HCC) [F20.9] 10/20/2015  . Psychosis [F29] 10/20/2015  . Syncope [R55] 09/18/2015  . Anxiety state [F41.1] 09/18/2015   Total Time spent with patient: 30 minutes  Past Psychiatric History: Per mother pt was hospitalized once in September ( per EHR it was for hyponatremia) and this is his second admission. Diagnosed with schizophrenia in 2002. Pt does not have any suicide attempts.  Past Medical History:  Past Medical History  Diagnosis Date  . Anxiety   . Panic attacks   . Depression   . Schizophrenia (HCC)    History reviewed. No pertinent past surgical history. Family History:  Family History  Problem Relation Age of Onset  . Diabetes Father    Family Psychiatric  History: Per mother one of patient's cousins have hx of depression.   Social History: Mother reports pt is single , unemployed , denies having any children. He went  up to 2 years of college. He stays with his mother in [place].  History  Alcohol Use No     History  Drug Use No    Social History   Social History  . Marital Status: Single    Spouse Name: N/A  . Number of Children: N/A  . Years of Education: N/A   Social History Main Topics  . Smoking status: Never Smoker   . Smokeless tobacco: None  . Alcohol Use: No  . Drug Use: No  . Sexual Activity: Not Asked   Other Topics Concern  . None   Social History Narrative   Additional Social History:    Pain Medications: None Prescriptions: Pt does not know what meds he is taking. Over the Counter: None History of alcohol / drug use?: No history  of alcohol / drug abuse                    Sleep: Fair  Appetite:  Fair  Current Medications: Current Facility-Administered Medications  Medication Dose Route Frequency Provider Last Rate Last Dose  . acetaminophen (TYLENOL) tablet 650 mg  650 mg Oral Q6H PRN Adam Hough, PA-C      . alum & mag hydroxide-simeth (MAALOX/MYLANTA) 200-200-20 MG/5ML suspension 30 mL  30 mL Oral Q4H PRN Adam Hough, PA-C      . amantadine (SYMMETREL) capsule 100 mg  100 mg Oral BID Jomarie Longs, MD      . benztropine (COGENTIN) tablet 0.5 mg  0.5 mg Oral Q6H PRN Jomarie Longs, MD   0.5 mg at 10/21/15 0219   Or  . benztropine mesylate (COGENTIN) injection 0.5 mg  0.5 mg Intramuscular Q6H PRN Jomarie Longs, MD      . cloNIDine (CATAPRES) tablet 0.1 mg  0.1 mg Oral BID Jomarie Longs, MD   0.1 mg at 10/21/15 0801  . cyanocobalamin tablet 500 mcg  500 mcg Oral Daily Adam Vari, MD      . feeding supplement (ENSURE ENLIVE) (ENSURE ENLIVE) liquid 237 mL  237 mL Oral TID BM Adam Mizuno, MD   237 mL at 10/21/15 0956  . haloperidol lactate (HALDOL) injection 5 mg  5 mg Intramuscular Q6H PRN Jomarie Longs, MD       Or  . haloperidol (HALDOL) tablet 5 mg  5 mg Oral Q6H PRN Jomarie Longs, MD   5 mg at 10/21/15 0220  . LORazepam (ATIVAN) tablet 1 mg  1 mg Oral Q6H PRN Jomarie Longs, MD   1 mg at 10/21/15 0944   Or  . LORazepam (ATIVAN) injection 1 mg  1 mg Intramuscular Q6H PRN Jomarie Longs, MD      . magnesium hydroxide (MILK OF MAGNESIA) suspension 30 mL  30 mL Oral Daily PRN Adam Hough, PA-C      . sertraline (ZOLOFT) tablet 25 mg  25 mg Oral Daily Jomarie Longs, MD        Lab Results:  Results for orders placed or performed during the hospital encounter of 10/19/15 (from the past 48 hour(s))  Glucose, capillary     Status: Abnormal   Collection Time: 10/20/15  4:21 AM  Result Value Ref Range   Glucose-Capillary 107 (H) 65 - 99 mg/dL  Ammonia     Status: Abnormal    Collection Time: 10/20/15  7:00 AM  Result Value Ref Range   Ammonia 36 (H) 9 - 35 umol/L  CBC with Differential/Platelet     Status: Abnormal  Collection Time: 10/20/15  7:30 AM  Result Value Ref Range   WBC 9.2 4.0 - 10.5 K/uL   RBC 3.93 (L) 4.22 - 5.81 MIL/uL   Hemoglobin 12.0 (L) 13.0 - 17.0 g/dL   HCT 40.9 (L) 81.1 - 91.4 %   MCV 90.1 78.0 - 100.0 fL   MCH 30.5 26.0 - 34.0 pg   MCHC 33.9 30.0 - 36.0 g/dL   RDW 78.2 95.6 - 21.3 %   Platelets 253 150 - 400 K/uL   Neutrophils Relative % 79 %   Neutro Abs 7.3 1.7 - 7.7 K/uL   Lymphocytes Relative 11 %   Lymphs Abs 1.0 0.7 - 4.0 K/uL   Monocytes Relative 10 %   Monocytes Absolute 0.9 0.1 - 1.0 K/uL   Eosinophils Relative 0 %   Eosinophils Absolute 0.0 0.0 - 0.7 K/uL   Basophils Relative 0 %   Basophils Absolute 0.0 0.0 - 0.1 K/uL  I-stat troponin, ED     Status: None   Collection Time: 10/20/15  7:50 AM  Result Value Ref Range   Troponin i, poc 0.02 0.00 - 0.08 ng/mL   Comment 3            Comment: Due to the release kinetics of cTnI, a negative result within the first hours of the onset of symptoms does not rule out myocardial infarction with certainty. If myocardial infarction is still suspected, repeat the test at appropriate intervals.   I-stat chem 8, ed     Status: Abnormal   Collection Time: 10/20/15  7:51 AM  Result Value Ref Range   Sodium 137 135 - 145 mmol/L   Potassium 4.3 3.5 - 5.1 mmol/L   Chloride 104 101 - 111 mmol/L   BUN 12 6 - 20 mg/dL   Creatinine, Ser 0.86 0.61 - 1.24 mg/dL   Glucose, Bld 578 (H) 65 - 99 mg/dL   Calcium, Ion 4.69 6.29 - 1.23 mmol/L   TCO2 23 0 - 100 mmol/L   Hemoglobin 12.6 (L) 13.0 - 17.0 g/dL   HCT 52.8 (L) 41.3 - 24.4 %  Urinalysis with microscopic     Status: Abnormal   Collection Time: 10/20/15 10:57 PM  Result Value Ref Range   Color, Urine YELLOW YELLOW   APPearance CLEAR CLEAR   Specific Gravity, Urine 1.016 1.005 - 1.030   pH 7.0 5.0 - 8.0   Glucose, UA NEGATIVE  NEGATIVE mg/dL   Hgb urine dipstick NEGATIVE NEGATIVE   Bilirubin Urine NEGATIVE NEGATIVE   Ketones, ur 15 (A) NEGATIVE mg/dL   Protein, ur NEGATIVE NEGATIVE mg/dL   Urobilinogen, UA 1.0 0.0 - 1.0 mg/dL   Nitrite NEGATIVE NEGATIVE   Leukocytes, UA NEGATIVE NEGATIVE   WBC, UA 0-2 <3 WBC/hpf   RBC / HPF 0-2 <3 RBC/hpf   Urine-Other MUCOUS PRESENT     Comment: Performed at [facility]  Vitamin B12     Status: None   Collection Time: 10/21/15  6:50 AM  Result Value Ref Range   Vitamin B-12 211 180 - 914 pg/mL    Comment: (NOTE) This assay is not validated for testing neonatal or myeloproliferative syndrome specimens for Vitamin B12 levels. Performed at [facility]   Folate     Status: None   Collection Time: 10/21/15  6:50 AM  Result Value Ref Range   Folate 10.8 >5.9 ng/mL    Comment: Performed at [facility]    Physical Findings: AIMS: Facial and Oral  Movements Muscles of Facial Expression: None, normal Lips and Perioral Area: None, normal Jaw: None, normal Tongue: None, normal,Extremity Movements Upper (arms, wrists, hands, fingers): None, normal Lower (legs, knees, ankles, toes): None, normal, Trunk Movements Neck, shoulders, hips: None, normal, Overall Severity Severity of abnormal movements (highest score from questions above): None, normal Incapacitation due to abnormal movements: None, normal Patient's awareness of abnormal movements (rate only patient's report): No Awareness, Dental Status Current problems with teeth and/or dentures?: Yes Does patient usually wear dentures?: No  CIWA:  CIWA-Ar Total: 3 COWS:  COWS Total Score: 5  Musculoskeletal: Strength & Muscle Tone: within normal limits Gait & Station: unsteady Patient leans: N/A  Psychiatric Specialty Exam: Review of Systems  Constitutional: Positive for malaise/fatigue.  Neurological: Positive for tremors.  Psychiatric/Behavioral: The patient is nervous/anxious.    All other systems reviewed and are negative.   Blood pressure 142/98, pulse 111, temperature 98.6 F (37 C), temperature source Oral, resp. rate 20, height 5\' 6"  (1.676 m), weight 84.823 kg (187 lb), SpO2 98 %.Body mass index is 30.2 kg/(m^2).  General Appearance: Casual  Eye Contact::  Fair  Speech:  Slurred  Volume:  Normal  Mood:  Anxious  Affect:  Appropriate  Thought Process:  Goal Directed  Orientation:  Full (Time, Place, and Person)  Thought Content:  Rumination  Suicidal Thoughts:  No  Homicidal Thoughts:  No  Memory:  Immediate;   Fair Recent;   Fair Remote;   Fair  Judgement:  Fair  Insight:  Fair  Psychomotor Activity:  Tremor  Concentration:  Poor  Recall:  [employer] of Knowledge:Fair  Language: Fair  Akathisia:  No  Handed:  Right  AIMS (if indicated):     Assets:  Communication Skills Desire for Improvement  ADL's:  Intact  Cognition: WNL  Sleep:  Number of Hours: 4.5   Treatment Plan Summary: Daily contact with patient to assess and evaluate symptoms and progress in treatment and Medication management   Pt was seen in ED per initial notes as very confused , disoriented and agitated - pulling out IV lines and having AMS. Pt was seen by ED physician - as well as Family medicine consult team - who diagnosed patient with Acute delirium - Per consult note per Family medicine done on 10/20/15-pt cleared per consult team as well as Dr.Jonnalagada and [facility] team and was transferred to [facility] for further management. Patient today seen as improving, he is alert , ox4 , denies any psychosis. Will continue treatment . Will continue Haldol 5 mg PO/IM prn for severe agitation along with Ativan 1 mg PO/IM q6h prn . Will continue Haldol decanoate 100 mg IM q4 weekly - last IM was on 10/13/15. Will start Zoloft 25 mg for anxiety sx , ?panic attacks. Will not restart Buspar for lack of efficacy. Will add Amantadine 100 mg po bid for tremors - unknown if 2/2 Haldol or other  psychotropics . Will monitor. Will continue to monitor vitals ,medication compliance and treatment side effects while patient is here.  Will monitor for medical issues as well as call consult as needed.  Will continue 1:1 precaution for safety.  Will monitor VS q6h. Placed pt on Falls precaution. Reviewed labs ,CBC , cmp -wnl , UDS - negative for amphetamines , cocaine, bzd, opioids, barbiturates, THC , BAL <5 , EKG- WNL , CT scan head - wnl .  Also reviewed TSH- wnl ( 09/18/15) , Hba1c- (09/18/15) - 5.9. Vitamin b12 - 211, Folate 10.8,  UA -reviewed- ketones -high. Will replace Vitamin b12 - start 500 ug daily. CSW will start working on disposition.  Patient to participate in therapeutic milieu .      Terion Hedman MD 10/21/2015, 11:30 AM

## 2015-10-21 NOTE — Progress Notes (Signed)
1:1 note Adam English is very restless, disoriented and unsteady on his feet. His speech is mumbled and he is slow to respond to commands for safety. Visible hand tremors. Medication administered. Sitter present. Will continue to monitor for safety and medication effectiveness.

## 2015-10-21 NOTE — Progress Notes (Addendum)
1:1 Notes 0800  Patient sitting in wheelchair in bathroom shaving with 1:1 present and assisting patient.  Patient denied SI and HI, contracts for safety.  Denied A/V hallucinations.  Denied pain.  Respirations even and unlabored.  No signs/symptoms of pain/distress noted on patient's face/body movements.  Safety maintained with 1:1 present per MD order.    1000  Patient took shower this morning.  Denied SI and HI.  Denied A/V hallucinations.  Respirations even and unlabored.  No signs/symptoms of pain/distress noted on patient's face/body movements.  Safety maintained with 1:1 present.  1100  Patient has been in room using wheelchair.  MD talked with patient this morning.  Denied SI and HI.  Denied A/V hallucinations.  Respirations even and unlabored.  No signs/symptoms of pain/distress noted on patient's face/body movements.  Safety maintained with 1:1 present per MD order.  1120  Patient's self inventory sheet, patient has fair sleep, no sleep medication given.  Good appetite, normal energy level, poor concentration.  Rated depression 6, hopeless 10, anxiety 4.  Denied withdrawals.  Has experienced tremors in past 24 hours.  No physical problems.  Pain, worst pain in past 24 hours, #7, back.  Goal is to go outside, play basketball, go to cafeteria, take medications, workshop.  No discharge plans.

## 2015-10-21 NOTE — Progress Notes (Signed)
1:1 Note Adam English has gotten very little sleep throughout the night. He finally rested around 330am. He was still confused and disoriented while he was awake. Continues to be unsteady on his feet and is a high fall risk. Will continue to monitor for safety. 1:1 sitter continues.

## 2015-10-21 NOTE — [facility] Group Notes (Signed)
[facility] LCSW Group Therapy  10/21/2015 1:15 pm  Type of Therapy: Process Group Therapy  Participation Level: Invited.  Chose to not attend.  Summary of Progress/Problems: Today's group addressed the issue of overcoming obstacles.  Patients were asked to identify their biggest obstacle post d/c that stands in the way of their on-going success, and then problem solve as to how to manage this.  Adam English, Adam English 10/21/2015   2:11 PM
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
## 2015-10-21 NOTE — Progress Notes (Signed)
LATE ENTRY:  Approximately 2200 Adam English was noted to be severely agitated and paranoid with auditory and visual hallucinations. He had pulled his scrub pants off and was attempting to leave the unit with only his scrub top on. He stated he was paranoid, seeing objects in his room and hearing voices but would not give me details on what the voices were telling him or what he was seeing. He is unaware of his current location and only oriented to self. Visible tremors of upper limbs present. He was also attempting to barricade himself in his room by pushing his body and chair up against the door. We were able to verbally deescalate the situation. Adam English was agreeable to taking his medications by mouth. On call provider notified.  Adam English continues to be agitated, picking at his bed sheets, flicking objects off his fingers that are not visible to this nurse, restless, spitting small pieces of food onto his sheets and getting up and down out of his bed.  Will continue to monitor. Continues with 1:1 sitter.

## 2015-10-21 NOTE — Progress Notes (Signed)
Pt did not attend karaoke wrap-up group this evening.

## 2015-10-21 NOTE — B[facility] Group Notes (Signed)
The focus of this group is to educate the patient on the purpose and policies of crisis stabilization and provide a format to answer questions about their admission.  The group details unit policies and expectations of patients while admitted.  Patient did not attend 0900 nurse education orientation group this morning.  Patient stayed in his room with 1:1 present for safety.

## 2015-10-21 NOTE — Progress Notes (Addendum)
1:1 notes: 1300  Patient sitting on his bed with 1:1 present.  Patient ate 100% of his lunch.  Denied SI and HI.  Denied A/V hallucinations.  Denied pain.  Respirations even and unlabored.  No signs/symptoms of pain/distressnoted on patient's face/body movements.  Safety maintained with 1:1 per MD order.  1600  Patient was walking in the hallway.  Patient laid on floor, denied falling on floor.  Was assisted to stand up in hallway by 2 MHT's.  Patient's BP was 180/99 P166 sitting.  Patient BP 174/101 Pulse 120 standing.  MD informed.  Patient stated he had a panic attack.  Was given cold packs for neck, fluids.  Patient presently laying in bed with eyes closed with 1:1 present for safety.  Respirations even and unlabored.  No signs/symptoms of pain/distress noted on patient's face/body movements.  Ativan prn given to patient for anxiety/agitation.  1:1 continues per MD order.  1700  Patient resting comfortably in bed with eyes closed.  Denied SI and HI.  Denied A/V hallucinations.  Respirations even and unlabored.  No signs/symptoms of pain/distress noted on patient's face/body movements.  1:1 continues for safety per MD order.  1800  Patient was given tylenol for leg pain.  Patient presently resting in bed with eyes closed.  Respirations even and unlabored.  No signs/symptoms of pain/distress noted on patient's face/body movements.  1:1 continues for patient's safety.

## 2015-10-21 NOTE — Progress Notes (Signed)
Pt refused evening vitals, RN notified.

## 2015-10-22 LAB — COMPREHENSIVE METABOLIC PANEL
ALK PHOS: 74 U/L (ref 38–126)
ALT: 47 U/L (ref 17–63)
AST: 50 U/L — ABNORMAL HIGH (ref 15–41)
Albumin: 4.1 g/dL (ref 3.5–5.0)
Anion gap: 9 (ref 5–15)
BUN: 10 mg/dL (ref 6–20)
CALCIUM: 10.1 mg/dL (ref 8.9–10.3)
CO2: 26 mmol/L (ref 22–32)
CREATININE: 1.17 mg/dL (ref 0.61–1.24)
Chloride: 102 mmol/L (ref 101–111)
Glucose, Bld: 103 mg/dL — ABNORMAL HIGH (ref 65–99)
Potassium: 4.1 mmol/L (ref 3.5–5.1)
Sodium: 137 mmol/L (ref 135–145)
Total Bilirubin: 0.4 mg/dL (ref 0.3–1.2)
Total Protein: 8 g/dL (ref 6.5–8.1)

## 2015-10-22 LAB — CBC
HCT: 36.8 % — ABNORMAL LOW (ref 39.0–52.0)
HEMOGLOBIN: 13.4 g/dL (ref 13.0–17.0)
MCH: 32.8 pg (ref 26.0–34.0)
MCHC: 36.4 g/dL — ABNORMAL HIGH (ref 30.0–36.0)
MCV: 90 fL (ref 78.0–100.0)
Platelets: 286 10*3/uL (ref 150–400)
RBC: 4.09 MIL/uL — AB (ref 4.22–5.81)
RDW: 13.2 % (ref 11.5–15.5)
WBC: 10.3 10*3/uL (ref 4.0–10.5)

## 2015-10-22 LAB — BASIC METABOLIC PANEL
ANION GAP: 9 (ref 5–15)
BUN: 10 mg/dL (ref 6–20)
CALCIUM: 10 mg/dL (ref 8.9–10.3)
CHLORIDE: 102 mmol/L (ref 101–111)
CO2: 26 mmol/L (ref 22–32)
CREATININE: 1.11 mg/dL (ref 0.61–1.24)
GFR calc non Af Amer: >60 mL/min (ref >60)
GLUCOSE: 103 mg/dL — AB (ref 65–99)
Potassium: 4.1 mmol/L (ref 3.5–5.1)
Sodium: 137 mmol/L (ref 135–145)

## 2015-10-22 LAB — D-DIMER, QUANTITATIVE (NOT AT ARMC): D DIMER QUANT: 0.76 ug{FEU}/mL — AB (ref 0.00–0.48)

## 2015-10-22 LAB — MAGNESIUM: MAGNESIUM: 1.9 mg/dL (ref 1.7–2.4)

## 2015-10-22 LAB — GLUCOSE, CAPILLARY: Glucose-Capillary: 143 mg/dL — ABNORMAL HIGH (ref 65–99)

## 2015-10-22 MED ORDER — CLONIDINE HCL 0.1 MG PO TABS
0.1000 mg | ORAL_TABLET | Freq: Once | ORAL | Status: AC
  Administered 2015-10-22: 0.1 mg via ORAL
  Filled 2015-10-22 (×2): qty 1

## 2015-10-22 MED ORDER — TRAZODONE HCL 50 MG PO TABS
50.0000 mg | ORAL_TABLET | Freq: Every day | ORAL | Status: DC
  Administered 2015-10-22 – 2015-10-25 (×4): 50 mg via ORAL
  Filled 2015-10-22 (×7): qty 1

## 2015-10-22 MED ORDER — METOPROLOL TARTRATE 12.5 MG HALF TABLET
12.5000 mg | ORAL_TABLET | Freq: Two times a day (BID) | ORAL | Status: DC
  Administered 2015-10-22 – 2015-10-24 (×5): 12.5 mg via ORAL
  Filled 2015-10-22 (×9): qty 1

## 2015-10-22 NOTE — Progress Notes (Signed)
1:1 Note  Adam English is resting in his bed with sitter by his side. He is restless however showing no signs of aggravation at this time. Will continue to monitor for patient's safety.

## 2015-10-22 NOTE — Progress Notes (Signed)
D Pt. Denies SI and HI, but difficult to get a response from pt.  Pt. Has no complaints of pain or discomfort . Pt. Is confused taking off his clothes, pushing the sitter aside and running down the hall without his clothes.  A Writer offered support and encouragement.  Pt. Did accept his medications.   R Pt. Continues to be restless but has been redirectable with sitter at his side.  Pt. Remains safe on the unit.

## 2015-10-22 NOTE — Progress Notes (Signed)
[facility]  10/22/2015 3:05 PM Richarda BladeStanley Moscato  MRN:  [id number] Subjective: Patient states " I am fine. I feel better today .Its just that I sometimes feel hot and that is when I try to remove my clothes.'    Objective:Adam Paulino DoorObie is a [age] y old AAM who is single , unemployed , lives [place] in [place] , has a hx of schizophrenia , is on Haldol decanoate LAI ( last dose - 10/13/15) presented to the ED for disorganized behavior . Pt per review of EHR had several ED visits the past few weeks. Pt was admitted to medical floor 09/17/15- 09/19/15 for polydipsia , hyponatremia . Pt was seen in ED on 10/17 for hyponatremia , and was advised to restrict his fluid intake . Pt was again seen in ED on 10/29 for disorganized behavior and was discharged to follow up with his out patient provider. Pt again was brought back to the ED 10/18/15 for disorganized behavior . [facility] , however had to be send back to the ED for chest pain , diaphoresis as well as elevated BP."  Patient seen and chart reviewed.Discussed patient with treatment team.  Pt today seen in his room. Pt was able to participate in the evaluation this AM with out any difficulty. Pt appeared alert, oriented x 3. Pt reports that he did not sleep all that well last night. Discussed restarting Trazodone which was effective in the past. Pt reports appetite as fair. Pt denies SI/HI , is not seen as responding to any internal stimuli. Pt however continues to report anxiety that is on and off . Per nursing - pt appeared disoriented last night , was seen as disrobing , and appeared to be anxious . However this AM he was noticed as calm .  Pt continues to be on 1:1 precaution for safety reasons as well as fall risk.  Principal Problem: Schizophrenia (HCC) Diagnosis:   Patient Active Problem List   Diagnosis Date Noted  . Altered mental status [R41.82] 10/20/2015  . Chest pain [R07.9] 10/20/2015  . Acute delirium [R41.0]  10/20/2015  . Schizophrenia (HCC) [F20.9] 10/20/2015  . Psychosis [F29] 10/20/2015  . Syncope [R55] 09/18/2015  . Anxiety state [F41.1] 09/18/2015   Total Time spent with patient: 30 minutes  Past Psychiatric History: Per mother pt was hospitalized once in September ( per EHR it was for hyponatremia) and this is his second admission. Diagnosed with schizophrenia in 2002. Pt does not have any suicide attempts.  Past Medical History:  Past Medical History  Diagnosis Date  . Anxiety   . Panic attacks   . Depression   . Schizophrenia (HCC)    History reviewed. No pertinent past surgical history. Family History:  Family History  Problem Relation Age of Onset  . Diabetes Father    Family Psychiatric  History: Per mother one of patient's cousins have hx of depression.   Social History: Mother reports pt is single , unemployed , denies having any children. He went up to 2 years of college. [place]  History  Alcohol Use No     History  Drug Use No    Social History   Social History  . Marital Status: Single    Spouse Name: N/A  . Number of Children: N/A  . Years of Education: N/A   Social History Main Topics  . Smoking status: Never Smoker   . Smokeless tobacco: None  .  Alcohol Use: No  . Drug Use: No  . Sexual Activity: Not Asked   Other Topics Concern  . None   Social History Narrative   Additional Social History:    Pain Medications: None Prescriptions: Pt does not know what meds he is taking. Over the Counter: None History of alcohol / drug use?: No history of alcohol / drug abuse                    Sleep: Poor  Appetite:  Fair  Current Medications: Current Facility-Administered Medications  Medication Dose Route Frequency Provider Last Rate Last Dose  . acetaminophen (TYLENOL) tablet 650 mg  650 mg Oral Q6H PRN Kerry Hough, PA-C   650 mg at 10/21/15 1717  . alum & mag hydroxide-simeth (MAALOX/MYLANTA) 200-200-20  MG/5ML suspension 30 mL  30 mL Oral Q4H PRN Kerry Hough, PA-C      . amantadine (SYMMETREL) capsule 100 mg  100 mg Oral BID Jomarie Longs, MD   100 mg at 10/22/15 0800  . benztropine (COGENTIN) tablet 0.5 mg  0.5 mg Oral Q6H PRN Jomarie Longs, MD   0.5 mg at 10/22/15 1317   Or  . benztropine mesylate (COGENTIN) injection 0.5 mg  0.5 mg Intramuscular Q6H PRN Jomarie Longs, MD      . cloNIDine (CATAPRES) tablet 0.1 mg  0.1 mg Oral BID Jomarie Longs, MD   0.1 mg at 10/22/15 0832  . cyanocobalamin tablet 500 mcg  500 mcg Oral Daily Jomarie Longs, MD   1,000 mcg at 10/22/15 0831  . feeding supplement (ENSURE ENLIVE) (ENSURE ENLIVE) liquid 237 mL  237 mL Oral TID BM Tynesha Free, MD   237 mL at 10/22/15 1318  . haloperidol lactate (HALDOL) injection 5 mg  5 mg Intramuscular Q6H PRN Jomarie Longs, MD       Or  . haloperidol (HALDOL) tablet 5 mg  5 mg Oral Q6H PRN Jomarie Longs, MD   5 mg at 10/22/15 1317  . LORazepam (ATIVAN) tablet 1 mg  1 mg Oral Q6H PRN Jomarie Longs, MD   1 mg at 10/22/15 1317   Or  . LORazepam (ATIVAN) injection 1 mg  1 mg Intramuscular Q6H PRN Jomarie Longs, MD      . magnesium hydroxide (MILK OF MAGNESIA) suspension 30 mL  30 mL Oral Daily PRN Kerry Hough, PA-C      . sertraline (ZOLOFT) tablet 25 mg  25 mg Oral Daily Jomarie Longs, MD   25 mg at 10/22/15 2130    Lab Results:  Results for orders placed or performed during the hospital encounter of 10/19/15 (from the past 48 hour(s))  Urinalysis with microscopic     Status: Abnormal   Collection Time: 10/20/15 10:57 PM  Result Value Ref Range   Color, Urine YELLOW YELLOW   APPearance CLEAR CLEAR   Specific Gravity, Urine 1.016 1.005 - 1.030   pH 7.0 5.0 - 8.0   Glucose, UA NEGATIVE NEGATIVE mg/dL   Hgb urine dipstick NEGATIVE NEGATIVE   Bilirubin Urine NEGATIVE NEGATIVE   Ketones, ur 15 (A) NEGATIVE mg/dL   Protein, ur NEGATIVE NEGATIVE mg/dL   Urobilinogen, UA 1.0 0.0 - 1.0 mg/dL   Nitrite  NEGATIVE NEGATIVE   Leukocytes, UA NEGATIVE NEGATIVE   WBC, UA 0-2 <3 WBC/hpf   RBC / HPF 0-2 <3 RBC/hpf   Urine-Other MUCOUS PRESENT     Comment: Performed at [facility]  Vitamin B12  Status: None   Collection Time: 10/21/15  6:50 AM  Result Value Ref Range   Vitamin B-12 211 180 - 914 pg/mL    Comment: (NOTE) This assay is not validated for testing neonatal or myeloproliferative syndrome specimens for Vitamin B12 levels. Performed at [facility]   Folate     Status: None   Collection Time: 10/21/15  6:50 AM  Result Value Ref Range   Folate 10.8 >5.9 ng/mL    Comment: Performed at [facility]  RPR     Status: None   Collection Time: 10/21/15  6:50 AM  Result Value Ref Range   RPR Ser Ql Non Reactive Non Reactive    Comment: (NOTE) Performed At: [facility] [address] [place], [place] [id number] Mila Homer MD [id number] Performed at [facility]   Glucose, capillary     Status: Abnormal   Collection Time: 10/22/15  1:24 PM  Result Value Ref Range   Glucose-Capillary 143 (H) 65 - 99 mg/dL    Physical Findings: AIMS: Facial and Oral Movements Muscles of Facial Expression: None, normal Lips and Perioral Area: None, normal Jaw: None, normal Tongue: None, normal,Extremity Movements Upper (arms, wrists, hands, fingers): None, normal Lower (legs, knees, ankles, toes): None, normal, Trunk Movements Neck, shoulders, hips: None, normal, Overall Severity Severity of abnormal movements (highest score from questions above): None, normal Incapacitation due to abnormal movements: None, normal Patient's awareness of abnormal movements (rate only patient's report): No Awareness, Dental Status Current problems with teeth and/or dentures?: Yes Does patient usually wear dentures?: No  CIWA:  CIWA-Ar Total: 6 COWS:  COWS Total Score: 16  Musculoskeletal: Strength & Muscle Tone: within normal limits Gait &  Station: unsteady Patient leans: N/A  Psychiatric Specialty Exam: Review of Systems  Constitutional: Positive for malaise/fatigue.  Neurological: Positive for tremors.  Psychiatric/Behavioral: The patient is nervous/anxious.   All other systems reviewed and are negative.   Blood pressure 132/72, pulse 109, temperature 97.7 F (36.5 C), temperature source Oral, resp. rate 18, height  (1.676 m), weight 84.823 kg (187 lb), SpO2 100 %.Body mass index is 30.2 kg/(m^2).  General Appearance: Casual  Eye Contact::  Fair  Speech:  Normal Rate  Volume:  Normal  Mood:  Anxious  Affect:  Appropriate  Thought Process:  Goal Directed  Orientation:  Full (Time, Place, and Person)  Thought Content:  Rumination  Suicidal Thoughts:  No  Homicidal Thoughts:  No  Memory:  Immediate;   Fair Recent;   Fair Remote;   Fair  Judgement:  Fair  Insight:  Fair  Psychomotor Activity:  Tremor  Concentration:  Poor  Recall:  [employer] of Knowledge:Fair  Language: Fair  Akathisia:  No  Handed:  Right  AIMS (if indicated):     Assets:  Communication Skills Desire for Improvement  ADL's:  Intact  Cognition: WNL  Sleep:  Number of Hours: 4.75   Treatment Plan Summary: Daily contact with patient to assess and evaluate symptoms and progress in treatment and Medication management   Pt was seen in ED per initial notes as very confused , disoriented and agitated - pulling out IV lines and having AMS. Pt was seen by ED physician - as well as Family medicine consult team - who diagnosed patient with Acute delirium - Per consult note per Family medicine done on 10/20/15-pt cleared per consult team as well as Dr.Jonnalagada and [facility] team and was transferred to [facility] for further management.  Patient today seen as improving, he is alert , ox4 , denies any psychosis. Will continue treatment .   Will continue Haldol 5 mg PO/IM prn for severe agitation along with Ativan 1 mg PO/IM q6h prn . Will continue Haldol  decanoate 100 mg IM q4 weekly - last IM was on 10/13/15. Will continue Zoloft 25 mg for anxiety sx , ?panic attacks. Will not restart Buspar for lack of efficacy. Will continue Amantadine 100 mg po bid for tremors - unknown if 2/2 Haldol or other psychotropics . Will monitor. Will continue to monitor vitals ,medication compliance and treatment side effects while patient is here.  Will monitor for medical issues as well as call consult as needed.  Will continue 1:1 precaution for safety.  Will monitor VS q6h. Placed pt on Falls precaution. Will replace Vitamin b12 - start 500 ug daily. Will consult Hospitalist for elevated BP as well as tachycardia . Mr. Marylou Flesher NP aware of this and will work on this . Will repeat BMP tonight due to hx of hyponatremia , needs to be followed up tomorrow. CSW will start working on disposition.  Patient to participate in therapeutic milieu .      Modena Bellemare MD 10/22/2015, 3:05 PM

## 2015-10-22 NOTE — Consult Note (Signed)
Patient Demographics  Adam English, is a 49 y.o. male   MRN: [id number]   DOB - [birth year]  Admit Date - 10/19/2015    Outpatient Primary MD for the patient is No PCP Per Patient  Consult requested in the Hospital by No att. providers found, On 10/22/2015    Reason for consult : hypertension and sinus tachycardia   With History of -  Past Medical History  Diagnosis Date  . Anxiety   . Panic attacks   . Depression   . Schizophrenia (HCC)       History reviewed. No pertinent past surgical history.  in for   Chief Complaint  Patient presents with  . Chest Pain  . Abdominal Pain     HPI  Adam English  is a 49 y.o. male, with h/o schizophrenia on haldol [facility] due to disorganized behavior, [facility] called for consult due to patient is having htn and sinus tachycardia, patient denies pain, no sob, oxygen saturation 100% on room air. No fever. No dysuria.      Review of Systems    In addition to the HPI above,  No Fever-chills, No Headache, No changes with Vision or hearing, No problems swallowing food or Liquids, No Chest pain, Cough or Shortness of Breath, No Abdominal pain, No Nausea or Vommitting, Bowel movements are regular, No Blood in stool or Urine, No dysuria, No new skin rashes or bruises, No new joints pains-aches,  No new weakness, tingling, numbness in any extremity, No recent weight gain or loss, No polyuria, polydypsia or polyphagia, No significant Mental Stressors.  A full 10 point Review of Systems was done, except as stated above, all other Review of Systems were negative.   Social History Social History  Substance Use Topics  . Smoking status: Never Smoker   . Smokeless tobacco: Not on file  . Alcohol Use: No     Family History Family History  Problem Relation Age of Onset  . Diabetes Father        Prior to Admission medications   Medication Sig Start Date End Date Taking? Authorizing Provider  benztropine (COGENTIN) 0.5 MG tablet Take 1 tablet (0.5 mg total) by mouth 2 (two) times daily. 09/19/15   Costin Otelia Sergeant, MD  busPIRone (BUSPAR) 10 MG tablet Take 10 mg by mouth 2 (two) times daily.    Historical Provider, MD  clonazePAM (KLONOPIN) 0.5 MG tablet Take 1 tablet (0.5 mg total) by mouth 2 (two) times daily as needed (anxiety). Patient not taking: Reported on 10/16/2015 09/19/15   Leatha Gilding, MD  haloperidol decanoate (HALDOL DECANOATE) 50 MG/ML injection Inject 100 mg into the muscle every 28 (twenty-eight) days.    Historical Provider, MD  hydrOXYzine (ATARAX/VISTARIL) 25 MG tablet Take 1 tablet (25 mg total) by mouth 3 (three) times daily. 10/16/15   Rolland Porter, MD    Anti-infectives    None      Scheduled Meds: . amantadine  100 mg Oral BID  . cloNIDine  0.1 mg Oral BID  . vitamin B-12  500 mcg Oral Daily  . feeding supplement (ENSURE ENLIVE)  237 mL Oral TID BM  . metoprolol tartrate  12.5 mg Oral BID  . sertraline  25 mg Oral Daily  . traZODone  50 mg Oral QHS   Continuous Infusions:  PRN Meds:.acetaminophen, alum & mag hydroxide-simeth, benztropine **OR** benztropine mesylate, haloperidol **OR** haloperidol lactate, LORazepam **OR** LORazepam, magnesium hydroxide  No Known Allergies  Physical Exam  Vitals  Blood pressure 132/72, pulse 109, temperature 97.7 F (36.5 C), temperature source Oral, resp. rate 18, height 5\' 6"  (1.676 m), weight 187 lb (84.823 kg), SpO2 100 %.   1. General sitting on the edge of the bed, in NAD,    2.  Flat affect, Oriented X 3. circumstantial thought process  3. No F.N deficits, ALL C.Nerves Intact, Strength 5/5 all 4 extremities, Sensation intact all 4 extremities, Plantars down going.  4. Ears and Eyes appear Normal, Conjunctivae clear, PERRLA. Moist Oral Mucosa.  5. Supple Neck, No JVD, No cervical  lymphadenopathy appriciated, No Carotid Bruits.  6. Symmetrical Chest wall movement, Good air movement bilaterally, CTAB.  7. Sinus tachycardia, No Gallops, Rubs or Murmurs, No Parasternal Heave.  8. Positive Bowel Sounds, Abdomen Soft, No tenderness, No organomegaly appriciated,No rebound -guarding or rigidity.  9.  No Cyanosis, Normal Skin Turgor, No Skin Rash or Bruise.  10. Good muscle tone,  joints appear normal , no effusions, Normal ROM.  11. No Palpable Lymph Nodes in Neck or Axillae    Data Review  CBC  Recent Labs Lab 10/18/15 2026 10/19/15 0604 10/20/15 0730 10/20/15 0751  WBC 10.5  --  9.2  --   HGB 13.2 14.3 12.0* 12.6*  HCT 38.7* 42.0 35.4* 37.0*  PLT 286  --  253  --   MCV 89.6  --  90.1  --   MCH 30.6  --  30.5  --   MCHC 34.1  --  33.9  --   RDW 13.5  --  13.5  --   LYMPHSABS 1.2  --  1.0  --   MONOABS 1.1*  --  0.9  --   EOSABS 0.1  --  0.0  --   BASOSABS 0.0  --  0.0  --    ------------------------------------------------------------------------------------------------------------------  Chemistries   Recent Labs Lab 10/18/15 2026 10/19/15 0604 10/20/15 0751  NA 132* 137 137  K 3.7 3.6 4.3  CL 99* 102 104  CO2 23  --   --   GLUCOSE 123* 122* 105*  BUN 14 12 12   CREATININE 1.11 1.00 0.90  CALCIUM 9.4  --   --   AST 60*  --   --   ALT 38  --   --   ALKPHOS 72  --   --   BILITOT 1.0  --   --    ------------------------------------------------------------------------------------------------------------------ estimated creatinine clearance is 102.5 mL/min (by C-G formula based on Cr of 0.9). ------------------------------------------------------------------------------------------------------------------ No results for input(s): TSH, T4TOTAL, T3FREE, THYROIDAB in the last 72 hours.  Invalid input(s): FREET3   Coagulation profile No results for input(s): INR, PROTIME in the last 168  hours. ------------------------------------------------------------------------------------------------------------------- No results for input(s): DDIMER in the last 72 hours. -------------------------------------------------------------------------------------------------------------------  Cardiac Enzymes No results for input(s): CKMB, TROPONINI, MYOGLOBIN in the last 168 hours.  Invalid input(s): CK ------------------------------------------------------------------------------------------------------------------ Invalid input(s): POCBNP   ---------------------------------------------------------------------------------------------------------------  Urinalysis    Component Value Date/Time  COLORURINE YELLOW 10/20/2015 2257   APPEARANCEUR CLEAR 10/20/2015 2257   LABSPEC 1.016 10/20/2015 2257   PHURINE 7.0 10/20/2015 2257   GLUCOSEU NEGATIVE 10/20/2015 2257   HGBUR NEGATIVE 10/20/2015 2257   BILIRUBINUR NEGATIVE 10/20/2015 2257   KETONESUR 15* 10/20/2015 2257   PROTEINUR NEGATIVE 10/20/2015 2257   UROBILINOGEN 1.0 10/20/2015 2257   NITRITE NEGATIVE 10/20/2015 2257   LEUKOCYTESUR NEGATIVE 10/20/2015 2257     Imaging results:   No results found.  My personal review of EKG: sinus tachycardia, QTc wnl, no Acute ST changes    Assessment & Plan  Principal Problem:   Schizophrenia (HCC) Active Problems:   Altered mental status   Chest pain   Acute delirium   Psychosis    1. Hypertension and sinus tachycardia from agitation? tsh 0.548, cbc/cmp/mag/ua//echo and oral lopressor for now.   2. Schizopherania; management per psychiatry      Plan discussed with patient and patient 's nurse who is in the room during encounter.   Thank you for the consult, we will follow the patient with you in the Hospital.   Ples Trudel M.D PhD on 10/22/2015 at 4:00 PM  Between 7am to 7pm - Pager - [phone]  After 7pm go to [email or web address] - password [email or web address]   Thank you for  the consult, we will follow the patient with you in the Hospital.   [facility] Office  [phone]

## 2015-10-22 NOTE — Progress Notes (Signed)
Pt did not attend wrap up group this evening.  

## 2015-10-22 NOTE — [facility] Group Notes (Signed)
[facility] LCSW Group Therapy   10/22/2015 1:31 PM  Type of Therapy: Group Therapy  Participation Level:  Invited. Chose not to attend.  Summary of Progress/Problems: Chaplain was here to lead a group on themes of hope and/or courage.   Jonathon JordanLynn B Ginna Schuur 10/22/2015 1:31 PM

## 2015-10-22 NOTE — [facility] Group Notes (Signed)
[facility]   10/22/2015 12:59 PM  Participation Quality:  Invited. Chose not to attend.   Jonathon JordanLynn B Promise Weldin

## 2015-10-22 NOTE — Progress Notes (Signed)
1:1 Note Adam English is once again agitated, paranoid and hallucinating. Adam English has taken all of his clothes off and is lying in bed totally naked with the sheet over his head. Adam English is sweating profusely and very tremulous. Adam English is oriented to self, knows current year and president and states [facility]. Adam English continues to pick at objects in the air and on his fingers that are not there. Adam English will not place his feet on the floor as if Adam English is seeing things on the floor that are preventing him from placing his feet down. Adam English states Adam English feels very anxious. Continue with 1:1 sitter at this time. Will continue to monitor for patient safety. Medicated for anxiety, tremors and agitation.

## 2015-10-22 NOTE — Progress Notes (Addendum)
Pt appears lucid this am and informed the nurse that he has pretzels all over his floor. He complains of feeling very hot and keeps the temperature in his room in the 60's. Pt earlier was very jittery and had an elevated BP and HR. MD made aware. Pt remains a 1:1 for safety. He is pleasant and cooperative. Pt requested a lot of water to drink stating,"I stay thirsty. " He does contract for safety and denies SI and HI. Pt is alert to person and place today. CIWA is a 7. Pt denies any hallucinations and at this time does not appear to be responding to internal stimuli-10am- pt remains in his room with limited interaction. He does speak when spoken to, Pt does not appear in distress at this time 1:15p_MD made aware of elevated BP. Waiting for a hosptialist to see the pt.Pts FSBS is 149.  1:30p_pt got on the floor and stated he was looking for rats. He then stated that bugs were crawling over over him and started to remove his paper scrubs. Pt at times does appear catatonic and his ensure dribbles out of his mouth. Pt states he feels better taking the haldol.-2pm- pt is changing his scrubs and telling the tech he has bugs all over him. Pt remains a 1;1 for safety. 6pm _pt appears less anxious and is requesting more to drink. Pt was gvien a pitcher of gatorade. 6:20pmHosptialit in with the pt. Pt was instructed not to drink water but to drink gatorade. Blood work drawn. Awaiting a urine on the pt. Phoned pharmacy to deliver lopressor 12,5mg .

## 2015-10-23 ENCOUNTER — Encounter (HOSPITAL_COMMUNITY): Payer: Self-pay | Admitting: Radiology

## 2015-10-23 ENCOUNTER — Inpatient Hospital Stay (HOSPITAL_COMMUNITY): Payer: Federal, State, Local not specified - Other

## 2015-10-23 DIAGNOSIS — I10 Essential (primary) hypertension: Secondary | ICD-10-CM

## 2015-10-23 DIAGNOSIS — R000 Tachycardia, unspecified: Secondary | ICD-10-CM

## 2015-10-23 DIAGNOSIS — F209 Schizophrenia, unspecified: Principal | ICD-10-CM

## 2015-10-23 LAB — URINALYSIS, ROUTINE W REFLEX MICROSCOPIC
Bilirubin Urine: NEGATIVE
GLUCOSE, UA: NEGATIVE mg/dL
HGB URINE DIPSTICK: NEGATIVE
Ketones, ur: NEGATIVE mg/dL
Leukocytes, UA: NEGATIVE
Nitrite: NEGATIVE
PH: 6.5 (ref 5.0–8.0)
PROTEIN: NEGATIVE mg/dL
SPECIFIC GRAVITY, URINE: 1.002 — AB (ref 1.005–1.030)
Urobilinogen, UA: 0.2 mg/dL (ref 0.0–1.0)

## 2015-10-23 LAB — T4, FREE: Free T4: 1.14 ng/dL — ABNORMAL HIGH (ref 0.61–1.12)

## 2015-10-23 MED ORDER — IOHEXOL 350 MG/ML SOLN
100.0000 mL | Freq: Once | INTRAVENOUS | Status: AC | PRN
  Administered 2015-10-23: 100 mL via INTRAVENOUS

## 2015-10-23 MED ORDER — IBUPROFEN 800 MG PO TABS
800.0000 mg | ORAL_TABLET | Freq: Once | ORAL | Status: AC
  Administered 2015-10-23: 800 mg via ORAL
  Filled 2015-10-23: qty 1

## 2015-10-23 NOTE — Progress Notes (Signed)
1:1note Pt in the room at this time visiting with the family member at this time. Pt stayed on the unit during dinner. Pt has pleasant and cooperative with unit rules. Denies pain at this time. Denies SI, HI and contracted for safety. Staff remain in the room. We will continue to monitor.

## 2015-10-23 NOTE — ED Notes (Signed)
[facility] With one of their sitters.  Pt. Only c/o is of left foot and ankle discomfort; as he struck it on a door trim whilst exiting a bathroom.  For reasons unknown to me he has orders for CT and 2D Echo; as he is not c/o any chest issues.

## 2015-10-23 NOTE — ED Notes (Signed)
PA at bedside.

## 2015-10-23 NOTE — Progress Notes (Addendum)
1:1 note Pt has been in the most of morning sleeping, pt took all his morning medication as scheduled, pt has been calm and dies SI, HI. No complain at this time. [facility] by staff. Pt was ambulatory  and denied being in pain. We will continue to monitor.

## 2015-10-23 NOTE — ED Notes (Signed)
Patient transported to X-ray 

## 2015-10-23 NOTE — [facility] Group Notes (Signed)
[facility] Group Notes:  (Nursing/MHT/Case Management/Adjunct)  Date:  10/23/2015  Time:  12:32 PM  Type of Therapy:  Psychoeducational Skills  Participation Level:  Did Not Attend  Participation Quality:  DID NOT ATTEND  Affect:  DID NOT ATTEND  Cognitive:  DID NOT ATTEND  Insight:  None  Engagement in Group:  DID NOT ATTEND  Modes of Intervention:  DID NOT ATTEND  Summary of Progress/Problems: Pt did not attend patient healthy coping skills group.  Bethann PunchesJane O Banjamin Stovall 10/23/2015, 12:32 PM

## 2015-10-23 NOTE — Progress Notes (Signed)
[facility] PROGRESS NOTE  Kahlen Morais ZOX:[id number] DOB: December 03, 1966 DOA: 10/19/2015 PCP: No PCP Per Patient  Assessment/Plan: #1 hypertension/sinus tachycardia Likely secondary to agitation. Comprehensive metabolic profile with a AST of 50 otherwise unremarkable. CBC unremarkable. Folate was 10.8. B-12 levels of 211. Urinalysis was nitrite negative leukocytes negative negative for protein. 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 2-D echo pending. Improvement with blood pressure. Improvement with tachycardia. Continue low-dose Lopressor and titrate as needed. Continue clonidine. Follow.  #2 schizophrenia Continue Zoloft, trazodone, amantadine. Per psychiatry.  Code Status: Full Family Communication: Updated patient. No family at bedside. Disposition Plan: Per primary team.   Consultants:  [facility]: Dr Roda Shutters 10/22/2015  Procedures:  CT angiogram chest 2015-11-11  Plain films of the left foot Nov 11, 2015  Plain films of the left ankle Nov 11, 2015  Antibiotics:  None  HPI/Subjective: Patient states he is feeling less anxious today. No chest pain. No shortness of breath.  Objective: Filed Vitals:   2015-11-11 1316  BP: 128/84  Pulse: 79  Temp:   Resp: 16   No intake or output data in the 24 hours ending 11-Nov-2015 1612 Filed Weights   10/19/15 1454  Weight: 84.823 kg (187 lb)    Exam:   General:  NAD  Cardiovascular: RRR  Respiratory: CTAB  Abdomen: Soft/NT/ND/+BS  Musculoskeletal: No c/c/e  Data Reviewed: Basic Metabolic Panel:  Recent Labs Lab 10/18/15 2026 10/19/15 0604 10/20/15 0751 10/22/15 1830  NA 132* 137 137 137  137  K 3.7 3.6 4.3 4.1  4.1  CL 99* 102 104 102  102  CO2 23  --   --  26  26  GLUCOSE 123* 122* 105* 103*  103*  BUN CREATININE 1.11 1.00 0.90 1.17  1.11  CALCIUM 9.4  --   --   10.1  10.0  MG  --   --   --  1.9   Liver Function Tests:  Recent Labs Lab 10/18/15 2026 10/22/15 1830  AST 60* 50*  ALT 38 47  ALKPHOS 72 74  BILITOT 1.0 0.4  PROT 7.2 8.0  ALBUMIN 3.8 4.1   No results for input(s): LIPASE, AMYLASE in the last 168 hours.  Recent Labs Lab 10/20/15 0700  AMMONIA 36*   CBC:  Recent Labs Lab 10/18/15 2026 10/19/15 0604 10/20/15 0730 10/20/15 0751 10/22/15 1830  WBC 10.5  --  9.2  --  10.3  NEUTROABS 8.1*  --  7.3  --   --   HGB 13.2 14.3 12.0* 12.6* 13.4  HCT 38.7* 42.0 35.4* 37.0* 36.8*  MCV 89.6  --  90.1  --  90.0  PLT 286  --  253  --  286   Cardiac Enzymes: No results for input(s): CKTOTAL, CKMB, CKMBINDEX, TROPONINI in the last 168 hours. BNP (last 3 results) No results for input(s): BNP in the last 8760 hours.  ProBNP (last 3 results) No results for input(s): PROBNP in the last 8760 hours.  CBG:  Recent Labs Lab 10/19/15 0547 10/20/15 0421 10/22/15 1324  GLUCAP 125* 107* 143*    No results found for this or any previous visit (from the past 240 hour(s)).   Studies: Dg Ankle Complete Left  11/11/15  CLINICAL DATA:  Left foot and ankle discomfort after striking foot on wall. Initial encounter. EXAM: LEFT ANKLE COMPLETE - 3+ VIEW COMPARISON:  Left foot radiographs - earlier same day FINDINGS: No fracture or dislocation. Joint spaces are preserved. Ankle mortise is preserved. No ankle joint effusion. Regional soft tissues appear normal. Small plantar calcaneal spur. IMPRESSION: 1. No acute findings. 2. Small plantar calcaneal spur. Electronically Signed   By: Simonne ComeJohn  Watts M.D.   On: 10/23/2015 11:40   Ct Angio Chest Pe W/cm &/or Wo Cm  10/23/2015  CLINICAL DATA:  History of schizophrenia. Hypertension and sinus tachycardia. Evaluate for pulmonary embolus. EXAM: CT ANGIOGRAPHY CHEST WITH CONTRAST TECHNIQUE: Multidetector CT imaging of the chest was performed using the standard protocol during bolus administration of  intravenous contrast. Multiplanar CT image reconstructions and MIPs were obtained to evaluate the vascular anatomy. CONTRAST:  100mL OMNIPAQUE IOHEXOL 350 MG/ML SOLN COMPARISON:  None. FINDINGS: Mediastinum: The heart size appears normal. There is no pericardial effusion. Moderate distension of the patulous esophagus. No mass. Trachea is patent and appears midline. The main pulmonary artery appears normal. No saddle embolus. No lobar or segmental pulmonary artery filling defects identified. Lungs/Pleura: No airspace consolidation or atelectasis. No suspicious pulmonary nodule or mass identified. Upper Abdomen: The visualized portions of the liver are normal. The adrenal glands are unremarkable. Normal appearance of the visualized portions of the spleen. Musculoskeletal: No aggressive lytic or sclerotic bone lesion. Review of the MIP images confirms the above findings. IMPRESSION: 1. Examination is negative for acute pulmonary embolus. 2. Patulous esophagus.  No mass identified. Electronically Signed   By: Signa Kellaylor  Stroud M.D.   On: 10/23/2015 12:21   Dg Foot Complete Left  10/23/2015  CLINICAL DATA:  49 year old male with a history of left foot pain EXAM: LEFT FOOT - COMPLETE 3+ VIEW COMPARISON:  None. FINDINGS: No acute fracture. No significant soft tissue swelling. No radiopaque foreign body. 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 Mild degenerative changes of the midfoot and hindfoot. IMPRESSION: Negative for acute bony abnormality. Signed, Yvone NeuJaime S. Loreta AveWagner, DO [facility] Electronically Signed   By: Gilmer MorJaime  Wagner D.O.   On: 10/23/2015 11:40    Scheduled Meds: . amantadine  100 mg Oral BID  . cloNIDine  0.1 mg Oral BID  . vitamin B-12  500 mcg Oral Daily  . feeding supplement (ENSURE ENLIVE)  237 mL Oral TID BM  . metoprolol tartrate  12.5 mg Oral BID  . sertraline  25 mg Oral Daily  . traZODone  50 mg Oral QHS    Continuous Infusions:   Principal Problem:   Schizophrenia (HCC) Active Problems:   Altered mental status   Chest pain   Acute delirium   Psychosis    Time spent: 35 mins    [facility] Anthem[facility]OMPSON,DANIEL MD [facility] Pager [phone]. If 7PM-7AM, please contact night-coverage at [email or web address], password [email or web address] 10/23/2015, 4:12 PM  LOS: 4 days

## 2015-10-23 NOTE — CV Procedure (Signed)
Called nurse at 9:30 to set up appointment for echo at 10:00 AM [facility] completed two xray's and a CT. [facility]. Echo with have to be rescheduled for another time.   Leta Junglingiffany Abeer [facility]en [facility] 3:22 PM 10/23/15

## 2015-10-23 NOTE — Progress Notes (Signed)
1:1 note Pt. Has been up and down all night, resting for brief periods at a time.  Pt. Was given Haldol, cogentin and ativan at 0234am to help with the agitation.  Pt. Did appear calmer and slept for a short time.  No complaints of pain or discomfort noted.  Resp. Even unlabored.  Pt. Remains safe on the unit.  Continue 1:1 to maintain patient safety.

## 2015-10-23 NOTE — Progress Notes (Signed)
1:1 note. Pt just got back from [facility] form his appointment. Pt was ambulatory and well when he got, no complains at this time. Pt denied SI, HI at this time. Staff with pt in the room. Safety maintained, we will continue to monitor.

## 2015-10-23 NOTE — Progress Notes (Signed)
[facility] MD Progress Note  10/23/2015   Adam English  MRN:  [id number] Subjective: Patient states " I'm feeling much better today. They sent me over there at [facility] for a scan."  Objective:Adam English is a 76 y old AAM who is single , unemployed , lives with his mother in [place] , has a hx of schizophrenia , is on Haldol decanoate LAI ( last dose - 10/13/15) presented to the ED for disorganized behavior . Pt per review of EHR had several ED visits the past few weeks. Pt was admitted to medical floor 09/17/15- 09/19/15 for polydipsia , hyponatremia . Pt was seen in ED on 10/17 for hyponatremia , and was advised to restrict his fluid intake . Pt was again seen in ED on 10/29 for disorganized behavior and was discharged to follow up with his out patient provider. Pt again was brought back to the ED 10/18/15 for disorganized behavior . Pt was admitted to [facility] , however had to be send back to the ED for chest pain, diaphoresis as well as elevated BP."  Pt seen and chart reviewed. Pt is alert/oriented x4, calm, cooperative. Pt denies suicidal/homicidal ideation and psychosis and does not appear to be responding to internal stimuli. Pt reports that he felt very confused lately but that he is feeling much better. Pt was observed to be eating in his room and in no acute distress. 2D echo pending; hospitalist following for recurrent hyponatremia with elevated D-dimer in addition to intermittent tachycardia and acute hypertension (although these problems are improving along with a concomitant improvement in level of consciousness and awareness/oritentation).   Principal Problem: Schizophrenia ([place]) Diagnosis:   Patient Active Problem List   Diagnosis Date Noted  . Schizophrenia ([place]) [F20.9] 10/20/2015    Priority: High  . Psychosis [F29] 10/20/2015    Priority: High  . Essential hypertension [I10]     Priority: Medium  . Tachycardia [R00.0]     Priority: Medium  . Altered mental status [R41.82] 10/20/2015     Priority: Medium  . Chest pain [R07.9] 10/20/2015    Priority: Medium  . Acute delirium [R41.0] 10/20/2015    Priority: Medium  . Syncope [R55] 09/18/2015  . Anxiety state [F41.1] 09/18/2015   Total Time spent with patient: 15 minutes  Past Psychiatric History: Per mother pt was hospitalized once in September ( per EHR it was for hyponatremia) and this is his second admission. Diagnosed with schizophrenia in 2002. Pt does not have any suicide attempts.  Past Medical History:  Past Medical History  Diagnosis Date  . Anxiety   . Panic attacks   . Depression   . Schizophrenia ([place])    History reviewed. No pertinent past surgical history. Family History:  Family History  Problem Relation Age of Onset  . Diabetes Father    Family Psychiatric  History: Per mother one of patient's cousins have hx of depression.   Social History: Mother reports pt is single , unemployed , denies having any children. He went up to 2 years of college. He stays with his mother in [place].  History  Alcohol Use No     History  Drug Use No    Social History   Social History  . Marital Status: Single    Spouse Name: N/A  . Number of Children: N/A  . Years of Education: N/A   Social History Main Topics  . Smoking status: Never Smoker   . Smokeless tobacco: None  . Alcohol Use: No  .  Drug Use: No  . Sexual Activity: Not Asked   Other Topics Concern  . None   Social History Narrative   Additional Social History:    Pain Medications: None Prescriptions: Pt does not know what meds he is taking. Over the Counter: None History of alcohol / drug use?: No history of alcohol / drug abuse                    Sleep: Poor  Appetite:  Fair  Current Medications: Current Facility-Administered Medications  Medication Dose Route Frequency Provider Last Rate Last Dose  . acetaminophen (TYLENOL) tablet 650 mg  650 mg Oral Q6H PRN Laverle Hobby, PA-C   650 mg at 10/23/15 0133  . alum & mag  hydroxide-simeth (MAALOX/MYLANTA) 200-200-20 MG/5ML suspension 30 mL  30 mL Oral Q4H PRN Laverle Hobby, PA-C      . amantadine (SYMMETREL) capsule 100 mg  100 mg Oral BID Ursula Alert, MD   100 mg at 10/23/15 0906  . benztropine (COGENTIN) tablet 0.5 mg  0.5 mg Oral Q6H PRN Ursula Alert, MD   0.5 mg at 10/23/15 0234   Or  . benztropine mesylate (COGENTIN) injection 0.5 mg  0.5 mg Intramuscular Q6H PRN Ursula Alert, MD      . cloNIDine (CATAPRES) tablet 0.1 mg  0.1 mg Oral BID Ursula Alert, MD   0.1 mg at 10/23/15 0903  . cyanocobalamin tablet 500 mcg  500 mcg Oral Daily Ursula Alert, MD   500 mcg at 10/23/15 0906  . feeding supplement (ENSURE ENLIVE) (ENSURE ENLIVE) liquid 237 mL  237 mL Oral TID BM Saramma Eappen, MD   237 mL at 10/23/15 0908  . haloperidol lactate (HALDOL) injection 5 mg  5 mg Intramuscular Q6H PRN Ursula Alert, MD       Or  . haloperidol (HALDOL) tablet 5 mg  5 mg Oral Q6H PRN Ursula Alert, MD   5 mg at 10/23/15 0234  . LORazepam (ATIVAN) tablet 1 mg  1 mg Oral Q6H PRN Ursula Alert, MD   1 mg at 10/23/15 0234   Or  . LORazepam (ATIVAN) injection 1 mg  1 mg Intramuscular Q6H PRN Ursula Alert, MD      . magnesium hydroxide (MILK OF MAGNESIA) suspension 30 mL  30 mL Oral Daily PRN Laverle Hobby, PA-C      . metoprolol tartrate (LOPRESSOR) tablet 12.5 mg  12.5 mg Oral BID Florencia Reasons, MD   12.5 mg at 10/23/15 0909  . sertraline (ZOLOFT) tablet 25 mg  25 mg Oral Daily Ursula Alert, MD   25 mg at 10/23/15 0906  . traZODone (DESYREL) tablet 50 mg  50 mg Oral QHS Ursula Alert, MD   50 mg at 10/22/15 2058    Lab Results:  Results for orders placed or performed during the hospital encounter of 10/19/15 (from the past 48 hour(s))  Glucose, capillary     Status: Abnormal   Collection Time: 10/22/15  1:24 PM  Result Value Ref Range   Glucose-Capillary 143 (H) 65 - 99 mg/dL  Basic metabolic panel     Status: Abnormal   Collection Time: 10/22/15  6:30 PM  Result  Value Ref Range   Sodium 137 135 - 145 mmol/L   Potassium 4.1 3.5 - 5.1 mmol/L   Chloride 102 101 - 111 mmol/L   CO2 26 22 - 32 mmol/L   Glucose, Bld 103 (H) 65 - 99 mg/dL   BUN 10 6 -  20 mg/dL   Creatinine, Ser 1.11 0.61 - 1.24 mg/dL   Calcium 10.0 8.9 - 10.3 mg/dL   GFR calc non Af Amer >60 >60 mL/min   GFR calc Af Amer >60 >60 mL/min    Comment: (NOTE) The eGFR has been calculated using the CKD EPI equation. This calculation has not been validated in all clinical situations. eGFR's persistently <60 mL/min signify possible Chronic Kidney Disease.    Anion gap 9 5 - 15    Comment: Performed at [facility]  CBC     Status: Abnormal   Collection Time: 10/22/15  6:30 PM  Result Value Ref Range   WBC 10.3 4.0 - 10.5 K/uL   RBC 4.09 (L) 4.22 - 5.81 MIL/uL   Hemoglobin 13.4 13.0 - 17.0 g/dL   HCT 36.8 (L) 39.0 - 52.0 %   MCV 90.0 78.0 - 100.0 fL   MCH 32.8 26.0 - 34.0 pg   MCHC 36.4 (H) 30.0 - 36.0 g/dL   RDW 13.2 11.5 - 15.5 %   Platelets 286 150 - 400 K/uL    Comment: Performed at [facility]  Comprehensive metabolic panel     Status: Abnormal   Collection Time: 10/22/15  6:30 PM  Result Value Ref Range   Sodium 137 135 - 145 mmol/L   Potassium 4.1 3.5 - 5.1 mmol/L   Chloride 102 101 - 111 mmol/L   CO2 26 22 - 32 mmol/L   Glucose, Bld 103 (H) 65 - 99 mg/dL   BUN 10 6 - 20 mg/dL   Creatinine, Ser 1.17 0.61 - 1.24 mg/dL   Calcium 10.1 8.9 - 10.3 mg/dL   Total Protein 8.0 6.5 - 8.1 g/dL   Albumin 4.1 3.5 - 5.0 g/dL   AST 50 (H) 15 - 41 U/L   ALT 47 17 - 63 U/L   Alkaline Phosphatase 74 38 - 126 U/L   Total Bilirubin 0.4 0.3 - 1.2 mg/dL   GFR calc non Af Amer >60 >60 mL/min   GFR calc Af Amer >60 >60 mL/min    Comment: (NOTE) The eGFR has been calculated using the CKD EPI equation. This calculation has not been validated in all clinical situations. eGFR's persistently <60 mL/min signify possible Chronic Kidney Disease.    Anion  gap 9 5 - 15    Comment: Performed at [facility]  Magnesium     Status: None   Collection Time: 10/22/15  6:30 PM  Result Value Ref Range   Magnesium 1.9 1.7 - 2.4 mg/dL    Comment: Performed at [facility]  D-dimer, quantitative (not at [facility])     Status: Abnormal   Collection Time: 10/22/15  6:30 PM  Result Value Ref Range   D-Dimer, Quant 0.76 (H) 0.00 - 0.48 ug/mL-FEU    Comment:        AT THE INHOUSE ESTABLISHED CUTOFF VALUE OF 0.48 ug/mL FEU, THIS ASSAY HAS BEEN DOCUMENTED IN THE LITERATURE TO HAVE A SENSITIVITY AND NEGATIVE PREDICTIVE VALUE OF AT LEAST 98 TO 99%.  THE TEST RESULT SHOULD BE CORRELATED WITH AN ASSESSMENT OF THE CLINICAL PROBABILITY OF DVT / VTE. Performed at [facility]   T4, free     Status: Abnormal   Collection Time: 10/23/15  6:34 AM  Result Value Ref Range   Free T4 1.14 (H) 0.61 - 1.12 ng/dL    Comment: Performed at [facility]  Urinalysis, Routine w reflex  microscopic (not at [facility])     Status: Abnormal   Collection Time: 10/23/15  6:55 AM  Result Value Ref Range   Color, Urine YELLOW YELLOW   APPearance CLEAR CLEAR   Specific Gravity, Urine 1.002 (L) 1.005 - 1.030   pH 6.5 5.0 - 8.0   Glucose, UA NEGATIVE NEGATIVE mg/dL   Hgb urine dipstick NEGATIVE NEGATIVE   Bilirubin Urine NEGATIVE NEGATIVE   Ketones, ur NEGATIVE NEGATIVE mg/dL   Protein, ur NEGATIVE NEGATIVE mg/dL   Urobilinogen, UA 0.2 0.0 - 1.0 mg/dL   Nitrite NEGATIVE NEGATIVE   Leukocytes, UA NEGATIVE NEGATIVE    Comment: MICROSCOPIC NOT DONE ON URINES WITH NEGATIVE PROTEIN, BLOOD, LEUKOCYTES, NITRITE, OR GLUCOSE <1000 mg/dL. Performed at [facility]     Physical Findings: AIMS: Facial and Oral Movements Muscles of Facial Expression: None, normal Lips and Perioral Area: None, normal Jaw: None, normal Tongue: None, normal,Extremity Movements Upper (arms, wrists, hands, fingers): None,  normal Lower (legs, knees, ankles, toes): None, normal, Trunk Movements Neck, shoulders, hips: None, normal, Overall Severity Severity of abnormal movements (highest score from questions above): None, normal Incapacitation due to abnormal movements: None, normal Patient's awareness of abnormal movements (rate only patient's report): No Awareness, Dental Status Current problems with teeth and/or dentures?: No Does patient usually wear dentures?: No  CIWA:  CIWA-Ar Total: 1 COWS:  COWS Total Score: 16  Musculoskeletal: Strength & Muscle Tone: within normal limits Gait & Station: unsteady Patient leans: N/A  Psychiatric Specialty Exam: Review of Systems  Constitutional: Positive for malaise/fatigue.  Neurological: Positive for tremors.  Psychiatric/Behavioral: The patient is nervous/anxious.   All other systems reviewed and are negative.   Blood pressure 128/84, pulse 79, temperature 98 F (36.7 C), temperature source Oral, resp. rate 16, height _0  (1.676 m), weight 84.823 kg (187 lb), SpO2 99 %.Body mass index is 30.2 kg/(m^2).  General Appearance: Casual  Eye Contact::  Good  Speech:  Normal Rate  Volume:  Normal  Mood:  Anxious and Depressed  Affect:  Appropriate, Congruent and Depressed  Thought Process:  Goal Directed  Orientation:  Full (Time, Place, and Person)  Thought Content:  Improving greatly and able to have a full conversation  Suicidal Thoughts:  No  Homicidal Thoughts:  No  Memory:  Immediate;   Fair Recent;   Fair Remote;   Fair  Judgement:  Fair  Insight:  Fair  Psychomotor Activity:  Tremor  Concentration:  Good  Recall:  [employer] [place]  Language: Fair  Akathisia:  No  Handed:  Right  AIMS (if indicated):     Assets:  Communication Skills Desire for Improvement  ADL's:  Intact  Cognition: WNL  Sleep:  Number of Hours: 4.75   *On 10/23/15, I have reviewed and concur with treatment plan below, modified as follows:   Treatment Plan  Summary: Daily contact with patient to assess and evaluate symptoms and progress in treatment and Medication management   Will continue Haldol 5 mg PO/IM prn for severe agitation along with Ativan 1 mg PO/IM q6h prn . Will continue Haldol decanoate 100 mg IM q4 weekly - last IM was on 10/13/15. Will continue Zoloft 25 mg for anxiety sx , ?panic attacks. Will not restart Buspar for lack of efficacy. Will continue Amantadine 100 mg po bid for tremors - unknown if 2/2 Haldol or other psychotropics . Will monitor. Will continue to monitor vitals ,medication compliance and treatment side effects while patient is here.  Will monitor for medical issues as well as call consult as needed.  Will continue 1:1 precaution for safety.  Will monitor VS q6h. Placed pt on [place] precaution. Will replace Vitamin b12 - start 500 ug daily. *Will consult Hospitalist for elevated BP as well as tachycardia. [facility] hospitalist following daily as of 10/23/15 Will repeat BMP tonight due to hx of hyponatremia , needs to be followed up tomorrow. CSW will start working on disposition.  Patient to participate in therapeutic milieu .   Benjamine Mola, FNP-BC 10/23/2015, 11:44 AM

## 2015-10-23 NOTE — ED Provider Notes (Signed)
CSN: [id number]     Arrival date & time 10/20/15  4098 History   First MD Initiated Contact with Patient 10/20/15 [phone]     Chief Complaint  Patient presents with  . Chest Pain  . Abdominal Pain     (Consider location/radiation/quality/duration/timing/severity/associated sxs/prior Treatment) HPI   Adam English is a 49 y.o. male, pt with history of schizophrenia, presents with pain in his left foot and toes since yesterday. Pt states he was hurting in his chest two days ago, but it hasn't occurred since then, and currently denies chest pain or abdominal pain. Pt confirms that he is not here for chest pain or abdominal pain. Pt rates pain at 9/10, shooting, extending from his left toes into his left ankle. Pt last BM was yesterday and was normal. Pt recount of HPI was very detailed and pt describes every step he took from yesterday to today. Pt states he was opening the bathroom door and got his foot caught underneath. Denies hematochezia, urinary complaints or difficulty, N/V/C/D, neurologic deficits, or any other pain or complaints. Pt is currently admitted as a pt to [facility] facility on [address], and accompanied by a tech from that facility, [place]. [name] states that he is accompanying pt for one on one supervision for some unrelated behavior.    Past Medical History  Diagnosis Date  . Anxiety   . Panic attacks   . Depression   . Schizophrenia ([facility])    History reviewed. No pertinent past surgical history. Family History  Problem Relation Age of Onset  . Diabetes Father    Social History  Substance Use Topics  . Smoking status: Never Smoker   . Smokeless tobacco: None  . Alcohol Use: No    Review of Systems  Musculoskeletal: Positive for arthralgias.       Left foot and ankle pain.      Allergies  Review of patient's allergies indicates no known allergies.  Home Medications   Prior to Admission medications   Medication Sig Start Date End Date Taking? Authorizing Provider   benztropine (COGENTIN) 0.5 MG tablet Take 1 tablet (0.5 mg total) by mouth 2 (two) times daily. 09/19/15   Costin Otelia Sergeant, MD  busPIRone (BUSPAR) 10 MG tablet Take 10 mg by mouth 2 (two) times daily.    Historical Provider, MD  clonazePAM (KLONOPIN) 0.5 MG tablet Take 1 tablet (0.5 mg total) by mouth 2 (two) times daily as needed (anxiety). Patient not taking: Reported on 10/16/2015 09/19/15   Leatha Gilding, MD  haloperidol decanoate (HALDOL DECANOATE) 50 MG/ML injection Inject 100 mg into the muscle every 28 (twenty-eight) days.    Historical Provider, MD  hydrOXYzine (ATARAX/VISTARIL) 25 MG tablet Take 1 tablet (25 mg total) by mouth 3 (three) times daily. 10/16/15   Rolland Porter, MD   BP 128/84 mmHg  Pulse 79  Temp(Src) 98 F (36.7 C) (Oral)  Resp 16  Ht  (1.676 m)  Wt 187 lb (84.823 kg)  BMI 30.20 kg/m2  SpO2 99% Physical Exam  Constitutional: He appears well-developed and well-nourished.  Cardiovascular: Normal rate, regular rhythm and intact distal pulses.   Pulmonary/Chest: Effort normal.  Musculoskeletal:  ROM fully intact. No crepitus, instability or deformity. Tenderness to dorsum of left foot as well as medial and lateral aspects of left ankle.   Neurological: He is alert.  No sensory deficits. Strength 5/5 in both lower extremities. No gait disturbance.   Skin: Skin is warm and dry.  Nursing note and  vitals reviewed.   ED Course  Procedures (including critical care time) Labs Review Labs Reviewed  GLUCOSE, CAPILLARY - Abnormal; Notable for the following:    Glucose-Capillary 107 (*)    All other components within normal limits  CBC WITH DIFFERENTIAL/PLATELET - Abnormal; Notable for the following:    RBC 3.93 (*)    Hemoglobin 12.0 (*)    HCT 35.4 (*)    All other components within normal limits  AMMONIA - Abnormal; Notable for the following:    Ammonia 36 (*)    All other components within normal limits  URINALYSIS W MICROSCOPIC - Abnormal; Notable for  the following:    Ketones, ur 15 (*)    All other components within normal limits  GLUCOSE, CAPILLARY - Abnormal; Notable for the following:    Glucose-Capillary 143 (*)    All other components within normal limits  BASIC METABOLIC PANEL - Abnormal; Notable for the following:    Glucose, Bld 103 (*)    All other components within normal limits  CBC - Abnormal; Notable for the following:    RBC 4.09 (*)    HCT 36.8 (*)    MCHC 36.4 (*)    All other components within normal limits  COMPREHENSIVE METABOLIC PANEL - Abnormal; Notable for the following:    Glucose, Bld 103 (*)    AST 50 (*)    All other components within normal limits  D-DIMER, QUANTITATIVE (NOT AT [facility]) - Abnormal; Notable for the following:    D-Dimer, Quant 0.76 (*)    All other components within normal limits  URINALYSIS, ROUTINE W REFLEX MICROSCOPIC (NOT AT [facility]) - Abnormal; Notable for the following:    Specific Gravity, Urine 1.002 (*)    All other components within normal limits  T4, FREE - Abnormal; Notable for the following:    Free T4 1.14 (*)    All other components within normal limits  I-STAT CHEM 8, ED - Abnormal; Notable for the following:    Glucose, Bld 105 (*)    Hemoglobin 12.6 (*)    HCT 37.0 (*)    All other components within normal limits  VITAMIN B12  FOLATE  RPR  MAGNESIUM  HEPATITIS PANEL, ACUTE  I-STAT TROPOININ, ED    Imaging Review Dg Ankle Complete Left  10/23/2015  CLINICAL DATA:  Left foot and ankle discomfort after striking foot on wall. Initial encounter. EXAM: LEFT ANKLE COMPLETE - 3+ VIEW COMPARISON:  Left foot radiographs - earlier same day FINDINGS: No fracture or dislocation. Joint spaces are preserved. Ankle mortise is preserved. No ankle joint effusion. Regional soft tissues appear normal. Small plantar calcaneal spur. IMPRESSION: 1. No acute findings. 2. Small plantar calcaneal spur. Electronically Signed   By: Simonne ComeJohn  Watts M.D.   On: 10/23/2015 11:40   Ct Angio Chest Pe  W/cm &/or Wo Cm  10/23/2015  CLINICAL DATA:  History of schizophrenia. Hypertension and sinus tachycardia. Evaluate for pulmonary embolus. EXAM: CT ANGIOGRAPHY CHEST WITH CONTRAST TECHNIQUE: Multidetector CT imaging of the chest was performed using the standard protocol during bolus administration of intravenous contrast. Multiplanar CT image reconstructions and MIPs were obtained to evaluate the vascular anatomy. CONTRAST:  100mL OMNIPAQUE IOHEXOL 350 MG/ML SOLN COMPARISON:  None. FINDINGS: Mediastinum: The heart size appears normal. There is no pericardial effusion. Moderate distension of the patulous esophagus. No mass. Trachea is patent and appears midline. The main pulmonary artery appears normal. No saddle embolus. No lobar or segmental pulmonary artery filling defects identified. Lungs/Pleura:  No airspace consolidation or atelectasis. No suspicious pulmonary nodule or mass identified. Upper Abdomen: The visualized portions of the liver are normal. The adrenal glands are unremarkable. Normal appearance of the visualized portions of the spleen. Musculoskeletal: No aggressive lytic or sclerotic bone lesion. Review of the MIP images confirms the above findings. IMPRESSION: 1. Examination is negative for acute pulmonary embolus. 2. Patulous esophagus.  No mass identified. Electronically Signed   By: Signa Kell M.D.   On: 10/23/2015 12:21   Dg Foot Complete Left  10/23/2015  CLINICAL DATA:  49 year old male with a history of left foot pain EXAM: LEFT FOOT - COMPLETE 3+ VIEW COMPARISON:  None. FINDINGS: No acute fracture. No significant soft tissue swelling. No radiopaque foreign body. 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 Mild degenerative changes of the midfoot and hindfoot. IMPRESSION: Negative for acute bony abnormality. Signed, Yvone Neu. Loreta Ave, DO [facility] Electronically Signed   By: Gilmer Mor D.O.   On: 10/23/2015  11:40   I have personally reviewed and evaluated these images and lab results as part of my medical decision-making.    MDM   Final diagnoses:  Acute delirium  Left foot pain    Richarda Blade presents with left foot and ankle pain.  Findings and plan of care discussed with Eber Hong, MD.  Foot and ankle exam reveals no abnormalities. X-rays of foot and ankle reveal no signs of fracture. Patient discharged. Patient and behavioral health tech were updated on plan of care and both parties agreed to the plan. When discharge was attempted it was noticed that patient's admission instructions and follow-up are still in the discharge section of his chart and the disposition was already set at admit. Disposition change to transfer for transfer back to behavioral health. It is important to note the patient was not seen here today for acute delirium or any other complaint other than his left foot and ankle pain, nor was he complaining of any other pain or issues.  Anselm Pancoast, PA-C 10/23/15 1606  Eber Hong, MD 10/24/15 [phone]

## 2015-10-24 LAB — HEPATITIS PANEL, ACUTE
HCV AB: 0.1 {s_co_ratio} (ref 0.0–0.9)
HEP A IGM: NEGATIVE
Hep B C IgM: NEGATIVE
Hepatitis B Surface Ag: NEGATIVE

## 2015-10-24 NOTE — Plan of Care (Signed)
Problem: Diagnosis: Increased Risk For Suicide Attempt Goal: LTG-Patient Will Report Improvement in Psychotic Symptoms LTG (by discharge) : Patient will report improvement in psychotic symptoms.  Outcome: Progressing Duffy RhodyStanley denies any auditory or visual hallucinations at this time.

## 2015-10-24 NOTE — Progress Notes (Signed)
D: Pt is resting in his room with eyes closed. No distress noted. Respirations are even and unlabored. A: 1:1 staff remains with pt for safety.  R: Pt remains safe on the unit.   

## 2015-10-24 NOTE — [facility] Group Notes (Signed)
[facility] Group Notes:  (Nursing/MHT/Case Management/Adjunct)  Date:  10/24/2015  Time:  10:33 AM  Type of Therapy:  Psychoeducational Skills  Participation Level:  Did Not Attend  Participation Quality:  N/A  Affect:  N/A  Cognitive:  N/A  Insight: N/A  Engagement in Group:  None  Modes of Intervention:  N/A  Summary of Progress/Problems: Patient was invited to group but did not attend.   

## 2015-10-24 NOTE — Progress Notes (Signed)
1:1 DAR Note: Adam RhodyStanley has been in his room much of the morning.  He did come out of his room for his medications.  He denies any SI/HI or A/V hallucinations.  He reports that he still shakes in his hands but doesn't feel it over his body.  He denies any physical complaints.  He appears to be in no physical distress.  Sitter remains by his side for safety. Q 15 minute checks maintained for safety.  He did not attend group or complete his self inventory after much encouragement.  1:1 Sitter remains for safety.

## 2015-10-24 NOTE — Progress Notes (Signed)
D: Pt is resting in her room with eyes closed. No distress noted. Respirations are even and unlabored. A: 1:1 staff remains with pt for safety.  R: Pt remains safe on the unit.   

## 2015-10-24 NOTE — Progress Notes (Signed)
[facility] Group Notes:  (Nursing/MHT/Case Management/Adjunct)  Date:  10/24/2015  Time:  10:56 PM  Type of Therapy:  Psychoeducational Skills  Participation Level:  Active  Participation Quality:  Attentive  Affect:  Flat  Cognitive:  Disorganized  Insight:  Lacking  Engagement in Group:  Developing/Improving  Modes of Intervention:  Education  Summary of Progress/Problems: The patient expressed in group that he was concerned about some missing clothing which he eventually located on his own. He states that going outside for fresh air helped him to feel better. As for the theme of the day, his support system will consist of his mother.   Hazle CocaGOODMAN, Elaine Roanhorse S 10/24/2015, 10:56 PM

## 2015-10-24 NOTE — Progress Notes (Signed)
1:1 DAR Note: Adam English has sitter by his side.  He continues to deny SI/HI or A/V hallucinations.  He takes his medications without difficulty.  He has not attended groups stating he would rather stay by himself.  He has been eating all meals.  He denies any pain or discomfort.  He appears to be in no physical distress.  No prn's required this shift.  Encouraged participation in group and unit activities.  Q 15 minute checks maintained for safety.  1:1 continued for safety.  We will continue to monitor the progress towards his goals.

## 2015-10-24 NOTE — Progress Notes (Signed)
DAR Note 1:1 Adam English has been in his room much of the day.  He didn't attend groups stating "I don't wan to go."  He has been pleasant and cooperative.  He denies any SI/HI or A/V hallucinations.  He continues to shake in his hands.  He walks slowly on his feet.  He denies any pain or discomfort.  1:1 sitter by his side.  He didn't complete his self inventory after much encouragement.  He is eating well and drinking ensures.  Encouraged participation in group/unit activities.  Q 15 minute checks maintained for safety.  We will monitor the progress towards his goals.  1:1 continues for safety.

## 2015-10-24 NOTE — Progress Notes (Signed)
D: Patient alert and oriented x 4. Patient denies pain/SI/HI/AVH. Patient continues 1:1 A: Staff to monitor Q 15 mins for safety. Encouragement and support offered. Scheduled medications administered per orders. R: Patient remains safe on the unit.Patient visible on the unit. Patient taking administered medications.

## 2015-10-24 NOTE — Progress Notes (Signed)
1:1 progress note:  Pt is lying in bed, not yet asleep.  He states he is feeling fine.  He is still a little shaky.  He was up earlier in the dayroom watching the football game, but decided he was too tried to watch it until the end.  He does not seem to be confused, but conversation with pt was minimal.  He was encouraged to make his needs known.  He voiced no needs or concerns at this time.  Pt continues on 1:1 for confusion and safety.  Sitter at bedside.  Support and encouragement offered.  Pt is safe at this time.

## 2015-10-24 NOTE — Progress Notes (Signed)
[facility] MD Progress Note  10/24/2015   Adam English  MRN:  [id number] Subjective: Patient states " I'm feeling good today. I'm eating more and I'm able to focus on groups more. I can think more clearly. I will count the amount of water I drink so I don't drink too much."  Objective:Adam English is a 20 y old AAM who is single , unemployed , lives with his mother in [place] , has a hx of schizophrenia , is on Haldol decanoate LAI ( last dose - 10/13/15) presented to the ED for disorganized behavior . Pt per review of EHR had several ED visits the past few weeks. Pt was admitted to medical floor 09/17/15- 09/19/15 for polydipsia , hyponatremia . Pt was seen in ED on 10/17 for hyponatremia , and was advised to restrict his fluid intake . Pt was again seen in ED on 10/29 for disorganized behavior and was discharged to follow up with his out patient provider. Pt again was brought back to the ED 10/18/15 for disorganized behavior . Pt was [facility] to [facility] , however had to be send back to the ED for chest pain, diaphoresis as well as elevated BP."  Pt seen and chart reviewed. Pt is alert/oriented x4, calm, cooperative. Pt denies suicidal/homicidal ideation and psychosis and does not appear to be responding to internal stimuli. Pt reports that he feels more clear today and that he is able to focus on group. Pt states that he will monitor his water intake to be more mindful of not drinking too much as he has had problems with polydipsia and hyponatremia multiple times prior to this admission. The hospitalist continues to follow this pt and they are optimistic as he continues to improve.    Principal Problem: Schizophrenia (HCC) Diagnosis:   Patient Active Problem List   Diagnosis Date Noted  . Schizophrenia (HCC) [F20.9] 10/20/2015    Priority: High  . Psychosis [F29] 10/20/2015    Priority: High  . Essential hypertension [I10]     Priority: Medium  . Tachycardia [R00.0]     Priority: Medium  . Altered mental  status [R41.82] 10/20/2015    Priority: Medium  . Chest pain [R07.9] 10/20/2015    Priority: Medium  . Acute delirium [R41.0] 10/20/2015    Priority: Medium  . Syncope [R55] 09/18/2015  . Anxiety state [F41.1] 09/18/2015   Total Time spent with patient: 15 minutes  Past Psychiatric History: Per mother pt was hospitalized once in September ( per EHR it was for hyponatremia) and this is his second admission. Diagnosed with schizophrenia in 2002. Pt does not have any suicide attempts.  Past Medical History:  Past Medical History  Diagnosis Date  . Anxiety   . Panic attacks   . Depression   . Schizophrenia (HCC)    History reviewed. No pertinent past surgical history. Family History:  Family History  Problem Relation Age of Onset  . Diabetes Father    Family Psychiatric  History: Per mother one of patient's cousins have hx of depression.   Social History: Mother reports pt is single , unemployed , denies having any children. He went up to 2 years of college. He stays with his mother in [place].  History  Alcohol Use No     History  Drug Use No    Social History   Social History  . Marital Status: Single    Spouse Name: N/A  . Number of Children: N/A  . Years of Education: N/A  Social History Main Topics  . Smoking status: Never Smoker   . Smokeless tobacco: None  . Alcohol Use: No  . Drug Use: No  . Sexual Activity: Not Asked   Other Topics Concern  . None   Social History Narrative   Additional Social History:    Pain Medications: None Prescriptions: Pt does not know what meds he is taking. Over the Counter: None History of alcohol / drug use?: No history of alcohol / drug abuse                    Sleep: Good  Appetite:  Good  Current Medications: Current Facility-Administered Medications  Medication Dose Route Frequency Provider Last Rate Last Dose  . acetaminophen (TYLENOL) tablet 650 mg  650 mg Oral Q6H PRN Laverle Hobby, PA-C   650 mg at  10/23/15 0133  . alum & mag hydroxide-simeth (MAALOX/MYLANTA) 200-200-20 MG/5ML suspension 30 mL  30 mL Oral Q4H PRN Laverle Hobby, PA-C      . amantadine (SYMMETREL) capsule 100 mg  100 mg Oral BID Ursula Alert, MD   100 mg at 10/24/15 0804  . benztropine (COGENTIN) tablet 0.5 mg  0.5 mg Oral Q6H PRN Ursula Alert, MD   0.5 mg at 10/23/15 0234   Or  . benztropine mesylate (COGENTIN) injection 0.5 mg  0.5 mg Intramuscular Q6H PRN Ursula Alert, MD      . cloNIDine (CATAPRES) tablet 0.1 mg  0.1 mg Oral BID Ursula Alert, MD   0.1 mg at 10/24/15 0804  . cyanocobalamin tablet 500 mcg  500 mcg Oral Daily Ursula Alert, MD   500 mcg at 10/24/15 0804  . feeding supplement (ENSURE ENLIVE) (ENSURE ENLIVE) liquid 237 mL  237 mL Oral TID BM Saramma Eappen, MD   237 mL at 10/24/15 1029  . haloperidol lactate (HALDOL) injection 5 mg  5 mg Intramuscular Q6H PRN Ursula Alert, MD       Or  . haloperidol (HALDOL) tablet 5 mg  5 mg Oral Q6H PRN Ursula Alert, MD   5 mg at 10/23/15 0234  . LORazepam (ATIVAN) tablet 1 mg  1 mg Oral Q6H PRN Ursula Alert, MD   1 mg at 10/23/15 0234   Or  . LORazepam (ATIVAN) injection 1 mg  1 mg Intramuscular Q6H PRN Ursula Alert, MD      . magnesium hydroxide (MILK OF MAGNESIA) suspension 30 mL  30 mL Oral Daily PRN Laverle Hobby, PA-C      . metoprolol tartrate (LOPRESSOR) tablet 12.5 mg  12.5 mg Oral BID Florencia Reasons, MD   12.5 mg at 10/24/15 0804  . sertraline (ZOLOFT) tablet 25 mg  25 mg Oral Daily Ursula Alert, MD   25 mg at 10/24/15 0804  . traZODone (DESYREL) tablet 50 mg  50 mg Oral QHS Ursula Alert, MD   50 mg at 10/23/15 2158    Lab Results:  Results for orders placed or performed during the hospital encounter of 10/19/15 (from the past 48 hour(s))  Glucose, capillary     Status: Abnormal   Collection Time: 10/22/15  1:24 PM  Result Value Ref Range   Glucose-Capillary 143 (H) 65 - 99 mg/dL  Basic metabolic panel     Status: Abnormal   Collection  Time: 10/22/15  6:30 PM  Result Value Ref Range   Sodium 137 135 - 145 mmol/L   Potassium 4.1 3.5 - 5.1 mmol/L   Chloride 102 101 - 111 mmol/L  CO2 26 22 - 32 mmol/L   Glucose, Bld 103 (H) 65 - 99 mg/dL   BUN 10 6 - 20 mg/dL   Creatinine, Ser 4.10 0.61 - 1.24 mg/dL   Calcium 51.7 8.9 - 10.7 mg/dL   GFR calc non Af Amer >60 >60 mL/min   GFR calc Af Amer >60 >60 mL/min    Comment: (NOTE) The eGFR has been calculated using the CKD EPI equation. This calculation has not been validated in all clinical situations. eGFR's persistently <60 mL/min signify possible Chronic Kidney Disease.    Anion gap 9 5 - 15    Comment: Performed at [facility]  CBC     Status: Abnormal   Collection Time: 10/22/15  6:30 PM  Result Value Ref Range   WBC 10.3 4.0 - 10.5 K/uL   RBC 4.09 (L) 4.22 - 5.81 MIL/uL   Hemoglobin 13.4 13.0 - 17.0 g/dL   HCT 85.0 (L) 20.7 - 98.1 %   MCV 90.0 78.0 - 100.0 fL   MCH 32.8 26.0 - 34.0 pg   MCHC 36.4 (H) 30.0 - 36.0 g/dL   RDW 74.7 86.8 - 97.6 %   Platelets 286 150 - 400 K/uL    Comment: Performed at [facility]  Comprehensive metabolic panel     Status: Abnormal   Collection Time: 10/22/15  6:30 PM  Result Value Ref Range   Sodium 137 135 - 145 mmol/L   Potassium 4.1 3.5 - 5.1 mmol/L   Chloride 102 101 - 111 mmol/L   CO2 26 22 - 32 mmol/L   Glucose, Bld 103 (H) 65 - 99 mg/dL   BUN 10 6 - 20 mg/dL   Creatinine, Ser 9.30 0.61 - 1.24 mg/dL   Calcium 08.8 8.9 - 90.1 mg/dL   Total Protein 8.0 6.5 - 8.1 g/dL   Albumin 4.1 3.5 - 5.0 g/dL   AST 50 (H) 15 - 41 U/L   ALT 47 17 - 63 U/L   Alkaline Phosphatase 74 38 - 126 U/L   Total Bilirubin 0.4 0.3 - 1.2 mg/dL   GFR calc non Af Amer >60 >60 mL/min   GFR calc Af Amer >60 >60 mL/min    Comment: (NOTE) The eGFR has been calculated using the CKD EPI equation. This calculation has not been validated in all clinical situations. eGFR's persistently <60 mL/min signify possible  Chronic Kidney Disease.    Anion gap 9 5 - 15    Comment: Performed at [facility]  Magnesium     Status: None   Collection Time: 10/22/15  6:30 PM  Result Value Ref Range   Magnesium 1.9 1.7 - 2.4 mg/dL    Comment: Performed at [facility]  D-dimer, quantitative (not at [facility])     Status: Abnormal   Collection Time: 10/22/15  6:30 PM  Result Value Ref Range   D-Dimer, Quant 0.76 (H) 0.00 - 0.48 ug/mL-FEU    Comment:        AT THE INHOUSE ESTABLISHED CUTOFF VALUE OF 0.48 ug/mL FEU, THIS ASSAY HAS BEEN DOCUMENTED IN THE LITERATURE TO HAVE A SENSITIVITY AND NEGATIVE PREDICTIVE VALUE OF AT LEAST 98 TO 99%.  THE TEST RESULT SHOULD BE CORRELATED WITH AN ASSESSMENT OF THE CLINICAL PROBABILITY OF DVT / VTE. Performed at [facility]   T4, free     Status: Abnormal   Collection Time: 10/23/15  6:34 AM  Result Value Ref Range  Free T4 1.14 (H) 0.61 - 1.12 ng/dL    Comment: Performed at [facility]  Hepatitis panel, acute     Status: None   Collection Time: 10/23/15  6:34 AM  Result Value Ref Range   Hepatitis B Surface Ag Negative Negative   HCV Ab 0.1 0.0 - 0.9 s/co ratio    Comment: (NOTE)                                  Negative:     < 0.8                             Indeterminate: 0.8 - 0.9                                  Positive:     > 0.9 The CDC recommends that a positive HCV antibody result be followed up with a HCV Nucleic Acid Amplification test (540086). Performed At: [facility] [place], [place] [id number] Lindon Romp MD IZ:[id number]    Hep A IgM Negative Negative   Hep B C IgM Negative Negative    Comment: Performed at [facility]  Urinalysis, Routine w reflex microscopic (not at [facility])     Status: Abnormal   Collection Time: 10/23/15  6:55 AM  Result Value Ref Range   Color, Urine YELLOW YELLOW   APPearance CLEAR CLEAR   Specific Gravity,  Urine 1.002 (L) 1.005 - 1.030   pH 6.5 5.0 - 8.0   Glucose, UA NEGATIVE NEGATIVE mg/dL   Hgb urine dipstick NEGATIVE NEGATIVE   Bilirubin Urine NEGATIVE NEGATIVE   Ketones, ur NEGATIVE NEGATIVE mg/dL   Protein, ur NEGATIVE NEGATIVE mg/dL   Urobilinogen, UA 0.2 0.0 - 1.0 mg/dL   Nitrite NEGATIVE NEGATIVE   Leukocytes, UA NEGATIVE NEGATIVE    Comment: MICROSCOPIC NOT DONE ON URINES WITH NEGATIVE PROTEIN, BLOOD, LEUKOCYTES, NITRITE, OR GLUCOSE <1000 mg/dL. Performed at [facility]     Physical Findings: AIMS: Facial and Oral Movements Muscles of Facial Expression: None, normal Lips and Perioral Area: None, normal Jaw: None, normal Tongue: None, normal,Extremity Movements Upper (arms, wrists, hands, fingers): None, normal Lower (legs, knees, ankles, toes): None, normal, Trunk Movements Neck, shoulders, hips: None, normal, Overall Severity Severity of abnormal movements (highest score from questions above): None, normal Incapacitation due to abnormal movements: None, normal Patient's awareness of abnormal movements (rate only patient's report): No Awareness, Dental Status Current problems with teeth and/or dentures?: No Does patient usually wear dentures?: No  CIWA:  CIWA-Ar Total: 4 COWS:  COWS Total Score: 16  Musculoskeletal: Strength & Muscle Tone: within normal limits Gait & Station: unsteady Patient leans: N/A  Psychiatric Specialty Exam: Review of Systems  Constitutional: Positive for malaise/fatigue.  Neurological: Positive for tremors.  Psychiatric/Behavioral: Positive for depression. Negative for suicidal ideas and hallucinations. The patient is nervous/anxious and has insomnia.   All other systems reviewed and are negative.   Blood pressure 130/89, pulse 109, temperature 97.6 F (36.4 C), temperature source Oral, resp. rate 16, height $RemoveBe'5\' 6"'ygvVlwpow$  (1.676 m), weight 84.823 kg (187 lb), SpO2 99 %.Body mass index is 30.2 kg/(m^2).  General Appearance:  Casual and Fairly Groomed  Eye Contact::  Good  Speech:  Normal Rate  Volume:  Normal  Mood:  Anxious and Depressed  Affect:  Appropriate, Congruent and Depressed  Thought Process:  Goal Directed  Orientation:  Full (Time, Place, and Person)  Thought Content:  Continues to improve greatly each day  Suicidal Thoughts:  No  Homicidal Thoughts:  No  Memory:  Immediate;   Fair Recent;   Fair Remote;   Fair  Judgement:  Fair  Insight:  Fair  Psychomotor Activity:  Tremor  Concentration:  Good  Recall:  [employer] [place]  Language: Fair  Akathisia:  No  Handed:  Right  AIMS (if indicated):     Assets:  Communication Skills Desire for Improvement  ADL's:  Intact  Cognition: WNL  Sleep:  Number of Hours: 7.5   *On 10/24/15, I have reviewed and concur with treatment plan below, modified as follows:   Treatment Plan Summary: Daily contact with patient to assess and evaluate symptoms and progress in treatment and Medication management   Will continue Haldol 5 mg PO/IM prn for severe agitation along with Ativan 1 mg PO/IM q6h prn . Will continue Haldol decanoate 100 mg IM q4 weekly - last IM was on 10/13/15. Will continue Zoloft 25 mg for anxiety sx , ?panic attacks. Will not restart Buspar for lack of efficacy. Will continue Amantadine 100 mg po bid for tremors - unknown if 2/2 Haldol or other psychotropics . Will monitor. Will continue to monitor vitals ,medication compliance and treatment side effects while patient is here.  Will monitor for medical issues as well as call consult as needed.  Will continue 1:1 precaution for safety.  Will monitor VS q6h. Placed pt on Falls precaution. Will replace Vitamin b12 - start 500 ug daily. *Will consult Hospitalist for elevated BP as well as tachycardia. [facility] hospitalist following daily as of 10/24/15 Will repeat BMP tonight due to hx of hyponatremia , needs to be followed up tomorrow. CSW will start working on disposition.   Patient to participate in therapeutic milieu .   Benjamine Mola, FNP-BC 10/24/2015, 11:14 AM

## 2015-10-24 NOTE — [facility] Group Notes (Signed)
[facility] Group Notes: (Clinical Social Work)   10/24/2015      Type of Therapy:  Group Therapy   Participation Level:  Did Not Attend despite MHT prompting   Blythe Veach Grossman-Orr, LCSW 10/24/2015, 11:56 AM     

## 2015-10-24 NOTE — Progress Notes (Addendum)
D: Pt is resting in his room with eyes closed. No distress noted. Respirations are even and unlabored. A: 1:1 staff remains with pt for safety.  R: Pt remains safe on the unit.   

## 2015-10-25 MED ORDER — NYSTATIN 100000 UNIT/ML MT SUSP
5.0000 mL | Freq: Four times a day (QID) | OROMUCOSAL | Status: DC
  Administered 2015-10-25 – 2015-10-28 (×11): 500000 [IU] via ORAL
  Filled 2015-10-25 (×19): qty 5

## 2015-10-25 MED ORDER — LORAZEPAM 0.5 MG PO TABS
0.5000 mg | ORAL_TABLET | Freq: Three times a day (TID) | ORAL | Status: DC
  Administered 2015-10-25 – 2015-10-28 (×9): 0.5 mg via ORAL
  Filled 2015-10-25 (×9): qty 1

## 2015-10-25 MED ORDER — METOPROLOL TARTRATE 25 MG PO TABS
25.0000 mg | ORAL_TABLET | Freq: Two times a day (BID) | ORAL | Status: DC
  Administered 2015-10-25 – 2015-10-28 (×7): 25 mg via ORAL
  Filled 2015-10-25 (×2): qty 1
  Filled 2015-10-25: qty 14
  Filled 2015-10-25 (×7): qty 1
  Filled 2015-10-25: qty 14
  Filled 2015-10-25: qty 1

## 2015-10-25 NOTE — Progress Notes (Signed)
1:1 DAR Note: Adam English is resting quietly in his room.  PRN ativan for CIWA 10 was effective.  He denies any SI/HI or A/V hallucinations.  1:1 sitter by side.  He denies any physical complaints.  He appears to be in no physical distress.  Notified Dr. Jama Flavorsobos about the need for cardiology consult before ordered ECHO can be performed.  Encouraged Adam English to attend group/unit activities.  1:1 continues for safety.  Q 15 minute checks maintained for safety.  We will continue to monitor the progress towards his goals.

## 2015-10-25 NOTE — Progress Notes (Addendum)
1:1 DAR Note: Adam English has been in his room most of the afternoon with 1:1 sitter by his side.  He continues to c/o of shakiness and doesn't feel as well as he did yesterday.  He denies any SI/HI or A/V hallucinations.  He denies any pain or discomfort.  He appears to be in no physical distress.  He is glad that he doesn't have a roommate now because he couldn't sleep last night.  He is hoping to sleep better tonight.  He stated that the ativan that he was given earlier worked.  Talked with him about the Ativan. Informed him that the doctor changed the orders and he is now going to be taking that scheduled.  [facility] and was given information about calling pager [phone]) tomorrow at 8am.  They will scheduled the appointment at that time.  Information will be passed to next shift and note written for that to be completed tomorrow.  1:1 continues for safety.  We will continue to monitor the progress towards his goals.

## 2015-10-25 NOTE — Progress Notes (Signed)
1:1 progress note:  Pt has been in bed awake since the beginning of the shift. He is still awake at this time.  He says he has not been able to fall asleep, but nothing is bothering him.  He denies SI/HI/AVH at this time.  He does not seem as shaky tonight as he did last night.  Pt was encouraged to make his needs known.  He continues on 1:1 for safety.  Sitter at bedside.  He is tolerating his meds well.  Pt safe at this time.

## 2015-10-25 NOTE — Progress Notes (Signed)
Writer was called to pt's room at the request of the sitter to look at pt's mouth.  He was c/o itching and discomfort to his mouth and tongue.  Writer discovered that pt has a yellowish/white coating to his tongue and throat.  PA on the unit was notified and an order for nystatin solution was given.  Pt was given his first dose tonight.

## 2015-10-25 NOTE — Progress Notes (Signed)
Pt resting in bed with eyes closed.  No distress observed.  Respirations even and unlabored.  Continue 1:1 for safety.  Sitter at bedside.  Pt safe at this time.

## 2015-10-25 NOTE — [facility] Group Notes (Signed)
[facility] Group Notes:  (Counselor/Nursing/MHT/Case Management/Adjunct)  10/25/2015 1:15PM  Type of Therapy:  Group Therapy  Participation Level:  Invited.  Chose to not attend  Summary of Progress/Problems: The topic for group was balance in life.  Pt participated in the discussion about when their life was in balance and out of balance and how this feels.  Pt discussed ways to get back in balance and short term goals they can work on to get where they want to be.     Ida Rogueorth, Macrae Wiegman B 10/25/2015 3:57 PM

## 2015-10-25 NOTE — Progress Notes (Signed)
1:1 DAR Note: Adam English has been in his room much of the morning.  1:1 sitter by side.  He was able to get up and take a shower.  He reports that he doesn't feel as steady as he did yesterday.  He feels that the shaking is worse and noted that he has been having trouble with his eyes being sensitive to light.  He denies any SI/HI or A/V hallucinations.  He was oriented to person and place.  He took AM medication without difficulty.  He denies any chest pain or dizziness.  No other c/o of pain.  He appears to be in no physical distress.  He did not complete his self inventory after much encouragement.  1:1 remains for safety.  Q 15 minute checks maintained for safety.  We will continue to monitor the progress towards his goals.

## 2015-10-25 NOTE — Progress Notes (Signed)
1:1 progress note:  Pt is lying in bed awake.  He has not been able to go back to sleep since receiving the Ativan 1mg  around 0440, because his roommate is snoring.  He says he is fine when asked if he is ok.  Pt voices no needs or concerns.  Continue 1:1 for safety.  Sitter at bedside.  Pt remains safe.

## 2015-10-25 NOTE — Progress Notes (Signed)
Patient's labs stable. CT angio chest was negative. 2-D echo was ordered but has not been done of yet. Blood pressure seems stable with slight increase in heart rate likely related to agitation and anxiety. Will increase Lopressor to 25 mg twice daily. Nothing further to add. Will sign off. Call with questions.

## 2015-10-25 NOTE — Tx Team (Signed)
Interdisciplinary Treatment Plan Update (Adult)  Date:  10/25/2015 Time Reviewed:  8:32 AM  Progress in Treatment: Attending groups: Pt new to milieu, still assessing. Participating in groups: NA. Taking medication as prescribed:  Yes. Tolerating medication:  Yes. Family/Significant other contact made:  Yes  Patient understands diagnosis:  Yes, as evidenced by seeking help with AVH. Discussing patient identified problems/goals with staff:  Yes, see initial care plan. Medical problems stabilized or resolved:  Yes Denies suicidal/homicidal ideation: Yes. Issues/concerns per patient self-inventory: No. Other:  New problem(s) identified:   Discharge Plan or Barriers: See below  Reason for Continuation of Hospitalization: Depression Hallucinations Medication stabilization Suicidal ideation  Comments: Adam English is a 73 y old AAM who is single , unemployed , lives with his mother in [place] , has a hx of schizophrenia , is on Haldol decanoate LAI ( last dose - 10/13/15) presented to the ED for disorganized behavior . Pt per review of EHR had several ED visits the past few weeks. Pt was admitted to medical floor 09/17/15- 09/19/15 for polydypsia , hyponatremia . Pt was seen in ED on 10/17 for hyponatremia , and was advised to restrict his fluid intake . Pt was again seen in ED on 10/29 for disorganized behavior and was discharged to follow up with his out patient provider. Pt again was brought back to the ED 10/18/15 for disorganized behavior . Pt was admitted to [facility] , however had to be send back to the ED for chest pain , diaphoresis as well as elevated BP. Pt was seen in ED per initial notes as very confused , disoriented and agitated - pulling out IV lines and having AMS. Pt was seen by ED physician - as well as Family medicine consult team - who diagnosed patient with Acute delirium likely 2/2 his recent Haldol decanoate IM. Pt refused injection prior to admission.   Haldol, Ativan, Zoloft  trial  10/25/15:  Adam English has been in his room much of the morning. 1:1 sitter by side. He was able to get up and take a shower. He reports that he doesn't feel as steady as he did yesterday. He feels that the shaking is worse and noted that he has been having trouble with his eyes being sensitive to light. He denies any SI/HI or A/V hallucinations.Poor sleep last night.   No longer getting haldol.  Continue with Zoloft, Ativan PRN, Trazodone for sleep  Estimated length of stay: 4-5 days New goal(s):  Review of initial/current patient goals per problem list:  1. Goal(s): Patient will participate in aftercare plan  Met:Yes  Target date: at discharge  As evidenced by: Patient will participate within aftercare plan AEB aftercare provider and housing plan at discharge being identified.  10/20/15: Return home, follow up [facility]  2. Goal (s): Patient will exhibit decreased depressive symptoms and suicidal ideations.  Met:Yes   Target date: at discharge  As evidenced by: Patient will utilize self rating of depression at 3 or below and demonstrate decreased signs of depression or be deemed stable for discharge by MD. 10/20/15: Pt endorses SI and has attempted to self-harm by running into objects on the hallway. CSW will continue to assess. 10/25/15:  Pt denies SI and depression today.    4. Goal(s): Patient will demonstrate decreased signs of psychosis.  Met: Yes  Target date:at discharge  As evidenced by: Patient will demonstrate decreased signs of psychosis as evidenced by a reduction in AVH, paranoia, and/or delusions.   10/20/15: Pt endorses AVH.  Pt states that he hears voices that tell him he's going to die. 10/25/15:  Pt denies AVH.  5. Goal(s): Patient will demonstrate decreased signs of withdrawal due to substance abuse   Met: No   Target date: 3-5 days post admission date   As evidenced by: Patient will produce a CIWA/COWS score of 0, have stable  vitals signs, and no symptoms of withdrawal 10/20/15: Pt currently has a CIWA score of 36. 10/25/15:  CIWA score of 10 with increased BP and Pulse    Attendees: Patient:  10/25/2015 8:32 AM  Family:   10/25/2015 8:32 AM  Physician:  Dr. Ursula Alert, MD 10/25/2015 8:32 AM  Nursing: Manuella Ghazi, RN   10/25/2015 8:32 AM  Case Manager:  Roque Lias, LCSW 10/25/2015 8:32 AM  Counselor:  Matthew Saras, MSW Intern 10/25/2015 8:32 AM  Other:   10/25/2015 8:32 AM  Other:   10/25/2015 8:32 AM  Other:   10/25/2015 8:32 AM  Other:  10/25/2015 8:32 AM  Other:    Other:    Other:    Other:    Other:    Other:      Scribe for Treatment Team:   Georga Kaufmann, MSW Intern 10/25/2015 8:32 AM

## 2015-10-25 NOTE — Progress Notes (Signed)
Patient ID: Adam English, male   DOB: [birth year], 49 y.o.   MRN: [id number] [facility] MD Progress Note  10/25/2015   Adam English  MRN:  [id number] Subjective: Patient  Reports he is not feeling as well as he was today. He does report poor sleep which he attributes to his roommate snoring loudly, keeping him awake most of the night. Patient  Partially attributes increased anxiety , symptoms today to poor sleep last night. Denies medication side effects  Objective: Patient seen and discussed with staff/CSW . 47 y old  Single male, has history of schizophrenia , is on Haldol decanoate IM ( last dose - 10/13/15).  Several recent visits to ED for disorganized behavior and also history of polydipsia, hyponatremia . Patient initially presented with disorganized behavior, confusion, delirium, and there was concern that presentation could be related to substance /ETOH withdrawal. Of note, patient has denied alcohol dependence and admission BAL was negative, UDS negative  As noted, patient reporting increased symptoms of anxiety today- attributing this to poor sleep. At this time does not appear grossly confused and was alert, attentive during session. Presents with slight distal tremors, and vague psychomotor restlessness, but not diaphoretic. Mild tachycardia and BP elevation.  He does have a history of HTN. No cogwheeling or akathisia noted . He denies medication side effects.  Principal Problem: Schizophrenia (HCC) Diagnosis:   Patient Active Problem List   Diagnosis Date Noted  . Essential hypertension [I10]   . Tachycardia [R00.0]   . Altered mental status [R41.82] 10/20/2015  . Chest pain [R07.9] 10/20/2015  . Acute delirium [R41.0] 10/20/2015  . Schizophrenia (HCC) [F20.9] 10/20/2015  . Psychosis [F29] 10/20/2015  . Syncope [R55] 09/18/2015  . Anxiety state [F41.1] 09/18/2015   Total Time spent with patient:  20 minutes   Past Psychiatric History: Per mother pt was hospitalized once in  September ( per EHR it was for hyponatremia) and this is his second admission. Diagnosed with schizophrenia in 2002. Pt does not have any suicide attempts.  Past Medical History:  Past Medical History  Diagnosis Date  . Anxiety   . Panic attacks   . Depression   . Schizophrenia (HCC)    History reviewed. No pertinent past surgical history. Family History:  Family History  Problem Relation Age of Onset  . Diabetes Father    Family Psychiatric  History: Per mother one of patient's cousins have hx of depression.   Social History: Mother reports pt is single , unemployed , denies having any children. He went up to 2 years of college. He stays with his mother in [place].  History  Alcohol Use No     History  Drug Use No    Social History   Social History  . Marital Status: Single    Spouse Name: N/A  . Number of Children: N/A  . Years of Education: N/A   Social History Main Topics  . Smoking status: Never Smoker   . Smokeless tobacco: None  . Alcohol Use: No  . Drug Use: No  . Sexual Activity: Not Asked   Other Topics Concern  . None   Social History Narrative   Additional Social History:    Pain Medications: None Prescriptions: Pt does not know what meds he is taking. Over the Counter: None History of alcohol / drug use?: No history of alcohol / drug abuse  Sleep:  Poor sleep last night which patient attributes to roommate snoring   Appetite:  Good  Current Medications: Current  Facility-Administered Medications  Medication Dose Route Frequency Provider Last Rate Last Dose  . acetaminophen (TYLENOL) tablet 650 mg  650 mg Oral Q6H PRN Kerry HoughSpencer E Simon, PA-C   650 mg at 10/23/15 0133  . alum & mag hydroxide-simeth (MAALOX/MYLANTA) 200-200-20 MG/5ML suspension 30 mL  30 mL Oral Q4H PRN Kerry HoughSpencer E Simon, PA-C      . amantadine (SYMMETREL) capsule 100 mg  100 mg Oral BID Jomarie LongsSaramma Eappen, MD   100 mg at 10/25/15 0757  . benztropine (COGENTIN) tablet 0.5 mg  0.5 mg Oral Q6H  PRN Jomarie LongsSaramma Eappen, MD   0.5 mg at 10/23/15 0234   Or  . benztropine mesylate (COGENTIN) injection 0.5 mg  0.5 mg Intramuscular Q6H PRN Jomarie LongsSaramma Eappen, MD      . cloNIDine (CATAPRES) tablet 0.1 mg  0.1 mg Oral BID Jomarie LongsSaramma Eappen, MD   0.1 mg at 10/25/15 0757  . cyanocobalamin tablet 500 mcg  500 mcg Oral Daily Jomarie LongsSaramma Eappen, MD   500 mcg at 10/25/15 0757  . feeding supplement (ENSURE ENLIVE) (ENSURE ENLIVE) liquid 237 mL  237 mL Oral TID BM Saramma Eappen, MD   237 mL at 10/25/15 1419  . haloperidol lactate (HALDOL) injection 5 mg  5 mg Intramuscular Q6H PRN Jomarie LongsSaramma Eappen, MD       Or  . haloperidol (HALDOL) tablet 5 mg  5 mg Oral Q6H PRN Jomarie LongsSaramma Eappen, MD   5 mg at 10/23/15 0234  . LORazepam (ATIVAN) tablet 1 mg  1 mg Oral Q6H PRN Jomarie LongsSaramma Eappen, MD   1 mg at 10/25/15 1202   Or  . LORazepam (ATIVAN) injection 1 mg  1 mg Intramuscular Q6H PRN Jomarie LongsSaramma Eappen, MD      . magnesium hydroxide (MILK OF MAGNESIA) suspension 30 mL  30 mL Oral Daily PRN Kerry HoughSpencer E Simon, PA-C      . metoprolol tartrate (LOPRESSOR) tablet 25 mg  25 mg Oral BID Rodolph Bonganiel Thompson V, MD   25 mg at 10/25/15 0819  . sertraline (ZOLOFT) tablet 25 mg  25 mg Oral Daily Jomarie LongsSaramma Eappen, MD   25 mg at 10/25/15 0757  . traZODone (DESYREL) tablet 50 mg  50 mg Oral QHS Jomarie LongsSaramma Eappen, MD   50 mg at 10/24/15 2147    Lab Results:  No results found for this or any previous visit (from the past 48 hour(s)).  Physical Findings: AIMS: Facial and Oral Movements Muscles of Facial Expression: None, normal Lips and Perioral Area: None, normal Jaw: None, normal Tongue: None, normal,Extremity Movements Upper (arms, wrists, hands, fingers): None, normal Lower (legs, knees, ankles, toes): None, normal, Trunk Movements Neck, shoulders, hips: None, normal, Overall Severity Severity of abnormal movements (highest score from questions above): None, normal Incapacitation due to abnormal movements: None, normal Patient's awareness of abnormal  movements (rate only patient's report): No Awareness, Dental Status Current problems with teeth and/or dentures?: No Does patient usually wear dentures?: No  CIWA:  CIWA-Ar Total: 3 COWS:  COWS Total Score: 16  Musculoskeletal: Strength & Muscle Tone: within normal limits Gait & Station: unsteady Patient leans: N/A  Psychiatric Specialty Exam: Review of Systems  Constitutional: Positive for malaise/fatigue.  Neurological: Positive for tremors.  Psychiatric/Behavioral: Positive for depression. Negative for suicidal ideas and hallucinations. The patient is nervous/anxious and has insomnia.   All other systems reviewed and are negative. at this time not endorsing chest pain or shortness of breath  Blood pressure 148/100, pulse 111, temperature 99 F (37.2 C), temperature source Oral, resp.  rate 16, height  (1.676 m), weight 187 lb (84.823 kg), SpO2 99 %.Body mass index is 30.2 kg/(m^2).  General Appearance: Fairly Groomed  Patent attorney::  Good  Speech:  Normal Rate  Volume:  Normal  Mood:  Anxious  Affect:   Congruent   Thought Process:  generally linear   Orientation:  Full (Time, Place, and Person)  Thought Content:   At this time not endorsing hallucinations and not appearing internally preoccupied   Suicidal Thoughts:  No- currently denies suicidal ideations or self injurious ideations  Homicidal Thoughts:  No  Memory:  Immediate;   Fair Recent;   Fair Remote;   Fair  Judgement:  Fair  Insight:  Fair  Psychomotor Activity:  Tremor  Concentration:  Good  Recall:  [employer] of Knowledge:Fair  Language: Fair  Akathisia:  No  Handed:  Right  AIMS (if indicated):     Assets:  Communication Skills Desire for Improvement  ADL's:  Intact  Cognition: WNL  Sleep:  Number of Hours: 5.25   Assessment- patient is a 49 year old male, history of schizophrenia, recent visits to ED , admitted with confusion, delirium, now improved. Report is that he had been improving  significantly , but that today he is again presenting more tremulous , anxious, which patient attributes to sleeping poorly last night due to roommate snoring loudly. Denies medication side effects and at present denies suicidal ideations .  Treatment Plan Summary: Daily contact with patient to assess and evaluate symptoms and progress in treatment and Medication management  Encourage patient to participate in milieu, groups to work on coping skills and symptom reduction Continue  Haldol 5 mg PO/IM  PRN  for severe agitation. Continue   Ativan 1 mg PO/IM Q 6 Hours PRN anxiety . Would start Ativan 0.5 mgrsd Q 8 hours for anxiety, restlessness, hold if sedated . Recheck BMP in AM.  Will continue Haldol decanoate 100 mg IM q4 weekly - last dose  was on 10/13/15- next one should be on or around 11/26.  Continue  Zoloft 25 mg QDAY for  anxiety  Would D/C Amantadine as this medication could be contributing to confusion, anxiety    Alyssah Algeo,  MD   10/25/2015, 11:14 AM

## 2015-10-26 ENCOUNTER — Inpatient Hospital Stay (HOSPITAL_COMMUNITY): Payer: Federal, State, Local not specified - Other

## 2015-10-26 DIAGNOSIS — R000 Tachycardia, unspecified: Secondary | ICD-10-CM

## 2015-10-26 MED ORDER — TRIHEXYPHENIDYL HCL 2 MG PO TABS
2.0000 mg | ORAL_TABLET | Freq: Two times a day (BID) | ORAL | Status: DC
  Administered 2015-10-26 – 2015-10-28 (×4): 2 mg via ORAL
  Filled 2015-10-26 (×6): qty 1
  Filled 2015-10-26 (×2): qty 14
  Filled 2015-10-26 (×2): qty 1

## 2015-10-26 MED ORDER — TRAZODONE HCL 50 MG PO TABS
75.0000 mg | ORAL_TABLET | Freq: Every day | ORAL | Status: DC
  Administered 2015-10-26: 75 mg via ORAL
  Filled 2015-10-26: qty 2
  Filled 2015-10-26 (×2): qty 1.5

## 2015-10-26 NOTE — Progress Notes (Signed)
Patient remains 1:1 observation for his safety.  Patient observed slightly anxious.  At times his legs are trembling.  PRN given for anxiety.  Will continue to monitor for safety.

## 2015-10-26 NOTE — Progress Notes (Signed)
1:1 progress note:  Pt has been awake for a little while, but lying in bed.  Pt voices no needs or complaints.  Continue 1:1 for safety.  Sitter at bedside.  Pt remains safe.

## 2015-10-26 NOTE — Progress Notes (Signed)
Pt lying in bed with eyes closed.  No distress observed.  MHT reports that pt is resting, but moving frequently in the bed. Continue 1:1 for safety.  Sitter at bedside.  Pt safe at this time

## 2015-10-26 NOTE — [facility] Group Notes (Signed)
[facility] LCSW Group Therapy  10/26/2015 , 2:29 PM   Type of Therapy:  Group Therapy  Participation Level:  Active  Participation Quality:  Attentive  Affect:  Appropriate  Cognitive:  Alert  Insight:  Improving  Engagement in Therapy:  Engaged  Modes of Intervention:  Discussion, Exploration and Socialization  Summary of Progress/Problems: Today's group focused on the term Diagnosis.  Participants were asked to define the term, and then pronounce whether it is a negative, positive or neutral term. Invited.  Chose to not attend.  Daryel Geraldorth, Cia Garretson B 10/26/2015 , 2:29 PM

## 2015-10-26 NOTE — Progress Notes (Signed)
1:1 DAR Note: Adam English has been pleasant and cooperative. 1:1 sitter by side.  He denies any SI/HI or A/V hallucinations.  He reports that his tremors are better.  CIWA was a 1.  He feels that he is steadier on his feet.  He denies any physical complaints.  He appears to be in no physical distress.  Encouraged participation in group and unit activities.  1:1 continues for safety.  Q 15 minute checks maintained for safety.  We will continue to monitor the progress towards his goals.

## 2015-10-26 NOTE — Progress Notes (Signed)
Patient ID: Adam English, male   DOB: 04-19-1966, 49 y.o.   MRN: [id number] [facility] MD Progress Note  10/26/2015   Adam English  MRN:  [id number] Subjective: Patient  reports " I am fine , I still have these shakes , but my dad also had it and it has been like this for years.'   Objective: Patient seen and discussed with treatment team. 89 y old  Single male, has history of schizophrenia , is on Haldol decanoate IM ( last dose - 10/13/15).  Several recent visits to ED for disorganized behavior and also history of polydipsia, hyponatremia . Patient initially presented with disorganized behavior, confusion, delirium, and there was concern that presentation could be related to substance /ETOH withdrawal. Of note, patient has denied alcohol dependence and admission BAL was negative, UDS negative   Pt today appears to be alert , oriented x 3. Pt does appear slowed down has tremors and has difficulty getting out of bed , at times looks like he is going to trip over his bed sheets . Hence will continue his 1;1 precaution for fall risk . Pt otherwise denies any AH/VH/paranoia. He does continues to have sleep issues, discussed increasing his Trazodone tonight. Presents with slight distal tremors, and vague psychomotor restlessness, but not diaphoretic.Continues to have mild tachycardia and BP elevation.  He does have a history of HTN.Pt was seen and managed by IM consult . Per staff pt continues to need encouragement, support  and has been compliant on medications.   Principal Problem: Schizophrenia (HCC) Diagnosis:   Patient Active Problem List   Diagnosis Date Noted  . Essential hypertension [I10]   . Tachycardia [R00.0]   . Altered mental status [R41.82] 10/20/2015  . Chest pain [R07.9] 10/20/2015  . Acute delirium [R41.0] 10/20/2015  . Schizophrenia (HCC) [F20.9] 10/20/2015  . Psychosis [F29] 10/20/2015  . Syncope [R55] 09/18/2015  . Anxiety state [F41.1] 09/18/2015   Total Time spent with  patient:  25 minutes   Past Psychiatric History: Per mother pt was hospitalized once in September ( per EHR it was for hyponatremia) and this is his second admission. Diagnosed with schizophrenia in 2002. Pt does not have any suicide attempts.  Past Medical History:  Past Medical History  Diagnosis Date  . Anxiety   . Panic attacks   . Depression   . Schizophrenia (HCC)    History reviewed. No pertinent past surgical history. Family History:  Family History  Problem Relation Age of Onset  . Diabetes Father    Family Psychiatric  History: Per mother one of patient's cousins have hx of depression.   Social History: Mother reports pt is single , unemployed , denies having any children. He went up to 2 years of college. He stays with his mother in [place].  History  Alcohol Use No     History  Drug Use No    Social History   Social History  . Marital Status: Single    Spouse Name: N/A  . Number of Children: N/A  . Years of Education: N/A   Social History Main Topics  . Smoking status: Never Smoker   . Smokeless tobacco: None  . Alcohol Use: No  . Drug Use: No  . Sexual Activity: Not Asked   Other Topics Concern  . None   Social History Narrative   Additional Social History:    Pain Medications: None Prescriptions: Pt does not know what meds he is taking. Over the Counter: None History of alcohol /  drug use?: No history of alcohol / drug abuse  Sleep:  Poor    Appetite:  Fair  Current Medications: Current Facility-Administered Medications  Medication Dose Route Frequency Provider Last Rate Last Dose  . acetaminophen (TYLENOL) tablet 650 mg  650 mg Oral Q6H PRN Kerry HoughSpencer E Simon, PA-C   650 mg at 10/23/15 0133  . alum & mag hydroxide-simeth (MAALOX/MYLANTA) 200-200-20 MG/5ML suspension 30 mL  30 mL Oral Q4H PRN Kerry HoughSpencer E Simon, PA-C      . benztropine (COGENTIN) tablet 0.5 mg  0.5 mg Oral Q6H PRN Jomarie LongsSaramma Tywon Niday, MD   0.5 mg at 10/23/15 0234   Or  . benztropine  mesylate (COGENTIN) injection 0.5 mg  0.5 mg Intramuscular Q6H PRN Jomarie LongsSaramma Keyshawna Prouse, MD      . cloNIDine (CATAPRES) tablet 0.1 mg  0.1 mg Oral BID Jomarie LongsSaramma Latoiya Maradiaga, MD   0.1 mg at 10/26/15 0833  . cyanocobalamin tablet 500 mcg  500 mcg Oral Daily Jomarie LongsSaramma Torence Palmeri, MD   500 mcg at 10/26/15 0832  . feeding supplement (ENSURE ENLIVE) (ENSURE ENLIVE) liquid 237 mL  237 mL Oral TID BM Slayde Brault, MD   237 mL at 10/26/15 1459  . haloperidol lactate (HALDOL) injection 5 mg  5 mg Intramuscular Q6H PRN Jomarie LongsSaramma Muhanad Torosyan, MD       Or  . haloperidol (HALDOL) tablet 5 mg  5 mg Oral Q6H PRN Jomarie LongsSaramma Jerrel Tiberio, MD   5 mg at 10/23/15 0234  . LORazepam (ATIVAN) tablet 1 mg  1 mg Oral Q6H PRN Jomarie LongsSaramma Presley Gora, MD   1 mg at 10/25/15 1202   Or  . LORazepam (ATIVAN) injection 1 mg  1 mg Intramuscular Q6H PRN Jomarie LongsSaramma Lakashia Collison, MD      . LORazepam (ATIVAN) tablet 0.5 mg  0.5 mg Oral TID Craige CottaFernando A Cobos, MD   0.5 mg at 10/26/15 1210  . magnesium hydroxide (MILK OF MAGNESIA) suspension 30 mL  30 mL Oral Daily PRN Kerry HoughSpencer E Simon, PA-C      . metoprolol tartrate (LOPRESSOR) tablet 25 mg  25 mg Oral BID Rodolph Bonganiel Thompson V, MD   25 mg at 10/26/15 [id number]  . nystatin (MYCOSTATIN) 100000 UNIT/ML suspension 500,000 Units  5 mL Oral QID Kerry HoughSpencer E Simon, PA-C   500,000 Units at 10/26/15 1210  . sertraline (ZOLOFT) tablet 25 mg  25 mg Oral Daily Jomarie LongsSaramma Roxanne Orner, MD   25 mg at 10/26/15 [id number]  . traZODone (DESYREL) tablet 75 mg  75 mg Oral QHS Joene Gelder, MD      . trihexyphenidyl (ARTANE) tablet 2 mg  2 mg Oral BID WC Jomarie LongsSaramma Shaughn Thomley, MD        Lab Results:  No results found for this or any previous visit (from the past 48 hour(s)).  Physical Findings: AIMS: Facial and Oral Movements Muscles of Facial Expression: None, normal Lips and Perioral Area: None, normal Jaw: None, normal Tongue: None, normal,Extremity Movements Upper (arms, wrists, hands, fingers): None, normal Lower (legs, knees, ankles, toes): None, normal, Trunk  Movements Neck, shoulders, hips: None, normal, Overall Severity Severity of abnormal movements (highest score from questions above): None, normal Incapacitation due to abnormal movements: None, normal Patient's awareness of abnormal movements (rate only patient's report): No Awareness, Dental Status Current problems with teeth and/or dentures?: No Does patient usually wear dentures?: No  CIWA:  CIWA-Ar Total: 1 COWS:  COWS Total Score: 16  Musculoskeletal: Strength & Muscle Tone: within normal limits Gait & Station: unsteady Patient leans: N/A  Psychiatric Specialty Exam:  Review of Systems  Constitutional: Positive for malaise/fatigue.  Neurological: Positive for tremors.  Psychiatric/Behavioral: Positive for depression. Negative for suicidal ideas and hallucinations. The patient is nervous/anxious and has insomnia.   All other systems reviewed and are negative. at this time not endorsing chest pain or shortness of breath  Blood pressure 139/98, pulse 118, temperature 98 F (36.7 C), temperature source Oral, resp. rate 18, height  (1.676 m), weight 84.823 kg (187 lb), SpO2 99 %.Body mass index is 30.2 kg/(m^2).  General Appearance: Fairly Groomed  Patent attorney::  Good  Speech:  Normal Rate  Volume:  Normal  Mood:  Anxious  Affect:   Congruent   Thought Process:  generally linear   Orientation:  Full (Time, Place, and Person)  Thought Content:   At this time not endorsing hallucinations and not appearing internally preoccupied   Suicidal Thoughts:  No- currently denies suicidal ideations or self injurious ideations  Homicidal Thoughts:  No  Memory:  Immediate;   Fair Recent;   Fair Remote;   Fair  Judgement:  Fair  Insight:  Fair  Psychomotor Activity:  Tremor  Concentration:  Good  Recall:  [employer] of Knowledge:Fair  Language: Fair  Akathisia:  No  Handed:  Right  AIMS (if indicated):     Assets:  Communication Skills Desire for Improvement  ADL's:  Intact   Cognition: WNL  Sleep:  Number of Hours: 4   Assessment- patient is a 49 year old male, history of schizophrenia, recent visits to ED , admitted with confusion, delirium, now improved. Pt continues to struggle with sleep issues as well as is tremulous. Will continue 1:1 precaution for safety .  Treatment Plan Summary: Daily contact with patient to assess and evaluate symptoms and progress in treatment and Medication management  Encourage patient to participate in milieu, groups to work on coping skills and symptom reduction Continue  Haldol 5 mg PO/IM  PRN  for severe agitation. Continue   Ativan 1 mg PO/IM Q 6 Hours PRN anxiety . Would start Ativan 0.5 mgrsd Q 8 hours for anxiety, restlessness, hold if sedated . Will continue Haldol decanoate 100 mg IM q4 weekly - last dose  was on 10/13/15- next one should be on or around 11/26.  Continue  Zoloft 25 mg QDAY for  anxiety . Will add artane 2 mg po bid for tremors. Will increase Trazodone to 75 mg po qhs for sleep. Will continue clonidine 0.1 mg po bid as well as Lopressor as scheduled for HTN. Pt had 2D Echo done today as per hospitalist recommendations- Hospitalist to follow up.    Keiton Cosma,  MD   10/26/2015, 3:16 pm

## 2015-10-26 NOTE — Progress Notes (Signed)
  Echocardiogram 2D Echocardiogram has been performed.  Janalyn HarderWest, Arabel Barcenas R 10/26/2015, 2:00 PM

## 2015-10-26 NOTE — Progress Notes (Signed)
1:1 DAR Note: Adam English arrived back on the unit after [facility] with sitter by side.  He states that he is feeling better today.  He denies any SI/HI or A/V hallucinations.  He denies any physical pain and appears to be in no physical distress.  Gait steadier today.  He completed his self inventory and he reports that his depression and anxiety are both 5/10.  His goal for today is to eat lunch.  Encouraged participation in group and unit activities.  1:1 sitter continues for safety.  We will continue to monitor the progress towards his goals.  Q 15 minute checks maintained.

## 2015-10-26 NOTE — [facility] Group Notes (Signed)
[facility] Group Notes:  (Nursing/MHT/Case Management/Adjunct)  Date:  10/26/2015  Time:  0930 Type of Therapy:  Nurse Education  Participation Level:  Did Not Attend           Adam English 10/26/2015, 11:55 AM

## 2015-10-26 NOTE — Progress Notes (Signed)
1:1 note:  Patient maintained on constant supervision for safety.  Patient is calm and cooperative.  Denies auditory and visual hallucinations.  Reports poor sleep disturbance.  Ambulating without difficulty.  Medication given as prescribed.  Denies pain or discomfort.

## 2015-10-27 MED ORDER — TRAZODONE HCL 100 MG PO TABS
100.0000 mg | ORAL_TABLET | Freq: Every day | ORAL | Status: DC
Start: 2015-10-27 — End: 2015-10-28
  Administered 2015-10-27: 100 mg via ORAL
  Filled 2015-10-27 (×2): qty 1
  Filled 2015-10-27: qty 7

## 2015-10-27 NOTE — Progress Notes (Signed)
1:1 DAR Note: Adam English remains on 1:1 with sitter by side.  He denies any SI/HI or A/V hallucinations.  He states that he feels better than when he first came in but still feels stiff and tired.  He denies any physical complaints and appears to be in no physical distress.  He continues to isolate.  Encouraged him to get out of his room and try to attend at least one group today.  He took his medications without difficulty.  He continues to report some unsteadiness.  Encouraged participation in group and unit activities.  We will continue to monitor the progress towards his goals. 1:1 will continue for safety and fall risk.   Pt remains safe on the unit.

## 2015-10-27 NOTE — Progress Notes (Signed)
Patient ID: Adam BladeStanley Lindner, male   DOB: July 19, 1966, 49 y.o.   MRN: [id number] D: Client in bed, but alert, able to tell me his date of birth and  name of president. "went outside" " went to breakfast and lunch" A: Writer provided emotional support, encouraged medications and group. Staff will monitor 1:1 for safety. R: Client is safe on unit, attended group.

## 2015-10-27 NOTE — Progress Notes (Signed)
Adult Psychoeducational Group Note  Date:  10/27/2015 Time:  9:59 PM  Group Topic/Focus:  Wrap-Up Group:   The focus of this group is to help patients review their daily goal of treatment and discuss progress on daily workbooks.  Participation Level:  Minimal  Participation Quality:  Appropriate  Affect:  Flat  Cognitive:  Lacking  Insight: Limited  Engagement in Group:  Engaged  Modes of Intervention:  Socialization and Support  Additional Comments:  Patient attended and participated in group tonight. He reports having a good day. He went down for his meals, went outside to play basketball. He did not attend his groups because he was sleeping.  Lita MainsFrancis, Hayze Gazda [facility] 10/27/2015, 9:59 PM

## 2015-10-27 NOTE — Progress Notes (Signed)
Patient sleeping on right side. Resp even and unlabored.  Remains 1:1 observation. Will continue to monitor.

## 2015-10-27 NOTE — Progress Notes (Signed)
Patient ID: Adam English, male   DOB: [birth year], [age] y.o.   MR[id number] [facility]  10/27/2015   Adam English  MRN:  [id number] Subjective: Patient  reports " I had a bad night again last night, I tired the ear plug , but I could not sleep.'    Objective: Patient seen and discussed with treatment team. [age] y old  Single male, has history of schizophrenia , is on Haldol decanoate IM ( last dose - 10/13/15).  Several recent visits to ED for disorganized behavior and also history of polydipsia, hyponatremia . Patient initially presented with disorganized behavior, confusion, delirium, and there was concern that presentation could be related to substance /ETOH withdrawal. Of note, patient has denied alcohol dependence and admission BAL was negative, UDS negative   Pt seen today . Pt continues to have sitter at bedside for safety. Pt continues to be slow , has psychomotor retardation and appears tremulous. Per staff - pt continues to have tremulousness of his legs and remains unsteady at times , making him a high fall risk. Pt also today reports sleep issues , he has been struggling with this since the past few days. Trazodone is being titrated up slowly , since he also was feeling drowsy , confused as well as unsteady at times. Will increase Trazodone further tonight. Also discussed sleep hygiene with pt - pt advised setting up a regular sleep time as well as sleep restriction. Pt also advised to restrict nap time during the day . Pt advised to attend group activities as well as go for activities with sitter. Per staff - pt has been compliant on his medications - no disruptive issues noted on the unit. Pt is more alert , oriented and did not have any periods of confusion this AM. Pt had 2 D Echo done yesterday ( per hospitalist) recommendations) - unremarkable.   Principal Problem: Schizophrenia (HCC) Diagnosis:   Patient Active Problem List   Diagnosis Date Noted  . Essential  hypertension [I10]   . Tachycardia [R00.0]   . Altered mental status [R41.82] 10/20/2015  . Chest pain [R07.9] 10/20/2015  . Acute delirium [R41.0] 10/20/2015  . Schizophrenia (HCC) [F20.9] 10/20/2015  . Psychosis [F29] 10/20/2015  . Syncope [R55] 09/18/2015  . Anxiety state [F41.1] 09/18/2015   Total Time spent with patient:  25 minutes   Past Psychiatric History: Per mother pt was hospitalized once in September ( per EHR it was for hyponatremia) and this is his second admission. Diagnosed with schizophrenia in 2002. Pt does not have any suicide attempts.  Past Medical History:  Past Medical History  Diagnosis Date  . Anxiety   . Panic attacks   . Depression   . Schizophrenia (HCC)    Family History:  Family History  Problem Relation Age of Onset  . Diabetes Father    Family Psychiatric  History: Per mother one of patient's cousins have hx of depression.   Social History: Mother reports pt is single , unemployed , denies having any children. He went up to 2 years of college. [place].  History  Alcohol Use No     History  Drug Use No    Social History   Social History  . Marital Status: Single    Spouse Name: N/A  . Number of Children: N/A  . Years of Education: N/A   Social History Main Topics  . Smoking status: Never Smoker   . Smokeless tobacco: None  .  Alcohol Use: No  . Drug Use: No  . Sexual Activity: Not Asked   Other Topics Concern  . None   Social History Narrative   Additional Social History:    Pain Medications: None Prescriptions: Pt does not know what meds he is taking. Over the Counter: None History of alcohol / drug use?: No history of alcohol / drug abuse  Sleep:  Poor, restless    Appetite:  Fair  Current Medications: Current Facility-Administered Medications  Medication Dose Route Frequency Provider Last Rate Last Dose  . acetaminophen (TYLENOL) tablet 650 mg  650 mg Oral Q6H PRN Kerry Hough, PA-C   650  mg at 10/23/15 0133  . alum & mag hydroxide-simeth (MAALOX/MYLANTA) 200-200-20 MG/5ML suspension 30 mL  30 mL Oral Q4H PRN Kerry Hough, PA-C      . benztropine (COGENTIN) tablet 0.5 mg  0.5 mg Oral Q6H PRN Jomarie Longs, MD   0.5 mg at 10/23/15 0234   Or  . benztropine mesylate (COGENTIN) injection 0.5 mg  0.5 mg Intramuscular Q6H PRN Jomarie Longs, MD      . cloNIDine (CATAPRES) tablet 0.1 mg  0.1 mg Oral BID Jomarie Longs, MD   0.1 mg at 10/27/15 0823  . cyanocobalamin tablet 500 mcg  500 mcg Oral Daily Jomarie Longs, MD   500 mcg at 10/27/15 0824  . feeding supplement (ENSURE ENLIVE) (ENSURE ENLIVE) liquid 237 mL  237 mL Oral TID BM Alondria Mousseau, MD   237 mL at 10/27/15 0957  . haloperidol lactate (HALDOL) injection 5 mg  5 mg Intramuscular Q6H PRN Jomarie Longs, MD       Or  . haloperidol (HALDOL) tablet 5 mg  5 mg Oral Q6H PRN Jomarie Longs, MD   5 mg at 10/23/15 0234  . LORazepam (ATIVAN) tablet 1 mg  1 mg Oral Q6H PRN Jomarie Longs, MD   1 mg at 10/26/15 2148   Or  . LORazepam (ATIVAN) injection 1 mg  1 mg Intramuscular Q6H PRN Jomarie Longs, MD      . LORazepam (ATIVAN) tablet 0.5 mg  0.5 mg Oral TID Craige Cotta, MD   0.5 mg at 10/27/15 0823  . magnesium hydroxide (MILK OF MAGNESIA) suspension 30 mL  30 mL Oral Daily PRN Kerry Hough, PA-C      . metoprolol tartrate (LOPRESSOR) tablet 25 mg  25 mg Oral BID Rodolph Bong, MD   25 mg at 10/27/15 0823  . nystatin (MYCOSTATIN) 100000 UNIT/ML suspension 500,000 Units  5 mL Oral QID Kerry Hough, PA-C   500,000 Units at 10/27/15 1610  . sertraline (ZOLOFT) tablet 25 mg  25 mg Oral Daily Jomarie Longs, MD   25 mg at 10/27/15 0823  . traZODone (DESYREL) tablet 100 mg  100 mg Oral QHS Kenlee Vogt, MD      . trihexyphenidyl (ARTANE) tablet 2 mg  2 mg Oral BID WC Jomarie Longs, MD   2 mg at 10/27/15 9604    Lab Results:  No results found for this or any previous visit (from the past 48 hour(s)).  Physical  Findings: AIMS: Facial and Oral Movements Muscles of Facial Expression: None, normal Lips and Perioral Area: None, normal Jaw: None, normal Tongue: None, normal,Extremity Movements Upper (arms, wrists, hands, fingers): None, normal Lower (legs, knees, ankles, toes): None, normal, Trunk Movements Neck, shoulders, hips: None, normal, Overall Severity Severity of abnormal movements (highest score from questions above): None, normal Incapacitation due to abnormal movements:  None, normal Patient's awareness of abnormal movements (rate only patient's report): No Awareness, Dental Status Current problems with teeth and/or dentures?: No Does patient usually wear dentures?: No  CIWA:  CIWA-Ar Total: 1 COWS:  COWS Total Score: 16  Musculoskeletal: Strength & Muscle Tone: within normal limits Gait & Station: unsteady Patient leans: N/A  Psychiatric Specialty Exam: Review of Systems  Constitutional: Positive for malaise/fatigue.  Neurological: Positive for tremors.  Psychiatric/Behavioral: Positive for depression. Negative for suicidal ideas and hallucinations. The patient is nervous/anxious and has insomnia.   All other systems reviewed and are negative. at this time not endorsing chest pain or shortness of breath  Blood pressure 120/86, pulse 108, temperature 97.8 F (36.6 C), temperature source Oral, resp. rate 16, height  (1.676 m), weight 84.823 kg (187 lb), SpO2 99 %.Body mass index is 30.2 kg/(m^2).  General Appearance: Fairly Groomed  Patent attorney::  Fair  Speech:  Normal Rate  Volume:  Normal  Mood:  Anxious  Affect:   Congruent   Thought Process:  generally linear   Orientation:  Full (Time, Place, and Person)  Thought Content:   At this time not endorsing hallucinations and not appearing internally preoccupied   Suicidal Thoughts:  No- currently denies suicidal ideations or self injurious ideations  Homicidal Thoughts:  No  Memory:  Immediate;   Fair Recent;    Fair Remote;   Fair  Judgement:  Fair  Insight:  Fair  Psychomotor Activity:  Tremor and has tremulousness of his legs  Concentration:  Fair  Recall:  [employer] of Knowledge:Fair  Language: Fair  Akathisia:  No  Handed:  Right  AIMS (if indicated):     Assets:  Communication Skills Desire for Improvement  ADL's:  Intact  Cognition: WNL  Sleep:  Number of Hours: 6.75   Assessment- patient is a 49 year old male, history of schizophrenia, recent visits to ED , admitted with confusion, delirium, now improved. Pt continues to struggle with sleep issues as well as is tremulous. Will continue 1:1 precaution for high fall risk.  Treatment Plan Summary: Daily contact with patient to assess and evaluate symptoms and progress in treatment and Medication management  Encourage patient to participate in milieu, groups to work on coping skills and symptom reduction Continue  Haldol 5 mg PO/IM  PRN  for severe agitation. Continue   Ativan 1 mg PO/IM Q 6 Hours PRN anxiety . Would start Ativan 0.5 mgrsd Q 8 hours for anxiety, restlessness, hold if sedated . Will continue Haldol decanoate , however will reduce dose to 75 mg q4 weekly ( due to tremulousness - unknown if this is secondary to his LAI) - last dose  was on 10/13/15- next one should be on or around 11/26.  Continue  Zoloft 25 mg QDAY for  anxiety . Added artane 2 mg po bid for tremors, will continue the same . Will increase Trazodone to 100 mg po qhs for sleep.Discussed sleep hygiene. Will continue clonidine 0.1 mg po bid as well as Lopressor as scheduled for HTN.2D ECHO done yesterday was unremarkable. CSW will work on disposition.   Nello Corro,  MD   10/27/2015, 11:01 AM

## 2015-10-27 NOTE — Progress Notes (Signed)
1:1 DAR Note: 1:1 sitter by side.  He denies any SI/HI or A/V hallucinations.  He has been up more today and was able to go outside. He went to the cafeteria for supper but had to encouraged to go as he wanted to stay in his room.  He states that he doesn't feel as shaky and more steady on his feet.  His main concern is that he has trouble getting out of bed.  He feels uncoordinated.  He has taken his medications without difficulty.  He denies any physical pain and appears to be in no physical distress.  Encouraged continued participation in group and unit activities.  He did not attend any groups today.  1:1 continues for safety.  Q 15 minute checks maintained for safety.  He remains safe at this time.  We will continue to monitor the progress towards his goals.

## 2015-10-27 NOTE — Progress Notes (Signed)
1:1 DAR Note: Adam English remains on 1:1 with sitter by side.  He was able to go to the cafeteria to eat lunch.  He stated that "I ate too much."  He continues to report that he feels unsteady on his feet and finds that it is really hard to get out of bed.  He denies any physical complaints.  He appears to be in no physical distress.  He took his 12 noon medications after lunch per his request.  He states that the medication for his tongue is helping but his tongue still hurts a little.  He completed his self inventory and reports that his depression, hopelessness and anxiety are 5/10.  He states that his goal is to go outside.  1:1 continues for safety.  Encouraged participation in group and unit activities.  Q 15 minute checks maintained for safety.  We will continue to monitor the progress towards his goals.  He remains safe on the unit.

## 2015-10-27 NOTE — [facility] Group Notes (Signed)
[facility] LCSW Group Therapy  10/27/2015 1:53 PM  Type of Therapy: Group Therapy  Participation Level: Invited. Chose not to attend.   Summary of Progress/Problems: Adam English from [employer] was here to tell his story of recovery and play his guitar.  Adam English 10/27/2015 1:53 PM

## 2015-10-27 NOTE — [facility] Group Notes (Signed)
[facility]   10/27/2015 10:11 AM  Participation Quality: Invited. Chose not to attend.   Jonathon JordanLynn B Syndi Pua

## 2015-10-28 DIAGNOSIS — R251 Tremor, unspecified: Secondary | ICD-10-CM | POA: Clinically undetermined

## 2015-10-28 MED ORDER — METOPROLOL TARTRATE 25 MG PO TABS: 25.0000 mg | ORAL_TABLET | Freq: Two times a day (BID) | ORAL | Status: AC

## 2015-10-28 MED ORDER — CYANOCOBALAMIN 500 MCG PO TABS: 500.0000 ug | ORAL_TABLET | Freq: Every day | ORAL | Status: AC

## 2015-10-28 MED ORDER — BENZTROPINE MESYLATE 1 MG/ML IJ SOLN: 1.0000 mg | INTRAMUSCULAR | Status: DC

## 2015-10-28 MED ORDER — TRAZODONE HCL 100 MG PO TABS: 100.0000 mg | ORAL_TABLET | Freq: Every day | ORAL | Status: AC

## 2015-10-28 MED ORDER — ENSURE ENLIVE PO LIQD: 237.0000 mL | Freq: Three times a day (TID) | ORAL | Status: AC

## 2015-10-28 MED ORDER — HALOPERIDOL DECANOATE 100 MG/ML IM SOLN: 75.0000 mg | INTRAMUSCULAR | Status: DC

## 2015-10-28 MED ORDER — TRIHEXYPHENIDYL HCL 2 MG PO TABS
2.0000 mg | ORAL_TABLET | Freq: Two times a day (BID) | ORAL | Status: AC
Start: 2015-10-28 — End: ?

## 2015-10-28 MED ORDER — HALOPERIDOL DECANOATE 100 MG/ML IM SOLN: 75.0000 mg | INTRAMUSCULAR | Status: AC

## 2015-10-28 MED ORDER — SERTRALINE HCL 25 MG PO TABS: 25.0000 mg | ORAL_TABLET | Freq: Every day | ORAL | Status: AC

## 2015-10-28 MED ORDER — CLONIDINE HCL 0.1 MG PO TABS: 0.1000 mg | ORAL_TABLET | Freq: Two times a day (BID) | ORAL | Status: AC

## 2015-10-28 MED ORDER — NYSTATIN 100000 UNIT/ML MT SUSP: 5.0000 mL | Freq: Four times a day (QID) | OROMUCOSAL | Status: AC

## 2015-10-28 NOTE — Progress Notes (Signed)
  [facility] :  Will you be returning to the same living situation after discharge:  Yes,  home At discharge, do you have transportation home?: Yes,  mother Do you have the ability to pay for your medications: Yes,  mental health  Release of information consent forms completed and in the chart;  Patient's signature needed at discharge.  Patient to Follow up at: Follow-up Information    [facility].   Specialty:  Behavioral Health   Why:  Wednesday at 1:20 for your next injection.  This will be for 75MG  instead of the 100MG  you were previously getting.   Contact information:   [address] [place] [address] [phone]       Next level of care provider [facility]: Unknown  Patient denies SI/HI: Yes,  yes    Safety Planning and Suicide Prevention discussed: Yes,  yes  Have you used any form of tobacco in the last 30 days? (Cigarettes, Smokeless Tobacco, Cigars, and/or Pipes): Patient Refused Screening  Has patient been referred to the Quitline?: N/A patient is not a smoker  [place], Avonda Toso B 10/28/2015, 9:46 AM

## 2015-10-28 NOTE — [facility] Suicide Risk Assessment (Signed)
[facility]   Demographic Factors:  Male  Total Time spent with patient: 30 minutes  Musculoskeletal: Strength & Muscle Tone: within normal limits Gait & Station: normal Patient leans: N/A  Psychiatric Specialty Exam: Physical Exam  Review of Systems  Neurological: Positive for tremors (improved).  All other systems reviewed and are negative.   Blood pressure 145/92, pulse 113, temperature 99.1 F (37.3 C), temperature source Oral, resp. rate 18, height 5\' 6"  (1.676 m), weight 84.823 kg (187 lb), SpO2 99 %.Body mass index is 30.2 kg/(m^2).  General Appearance: Casual  Eye Contact::  Fair  Speech:  Clear and Coherent409  Volume:  Normal  Mood:  Euthymic  Affect:  Appropriate  Thought Process:  Coherent  Orientation:  Full (Time, Place, and Person)  Thought Content:  WDL  Suicidal Thoughts:  No  Homicidal Thoughts:  No  Memory:  Immediate;   Fair Recent;   Fair Remote;   Fair  Judgement:  Fair  Insight:  Fair  Psychomotor Activity:  Normal  Concentration:  Fair  Recall:  [employer]Fair  Fund of Knowledge:Fair  Language: Fair  Akathisia:  No  Handed:  Right  AIMS (if indicated):     Assets:  Communication Skills Desire for Improvement  Sleep:  Number of Hours: 4.75  Cognition: WNL  ADL's:  Intact   Have you used any form of tobacco in the last 30 days? (Cigarettes, Smokeless Tobacco, Cigars, and/or Pipes): Patient Refused Screening  Has this patient used any form of tobacco in the last 30 days? (Cigarettes, Smokeless Tobacco, Cigars, and/or Pipes) No  Mental Status Per Nursing Assessment::   On Admission:     Current Mental Status by Physician: Pt denies SI/HI/AH/VH  Loss Factors: NA  Historical Factors: Impulsivity  Risk Reduction Factors:   Positive social support  Continued Clinical Symptoms:  Previous Psychiatric Diagnoses and Treatments Medical Diagnoses and Treatments/Surgeries  Cognitive Features That Contribute To Risk:   None    Suicide Risk:  Minimal: No identifiable suicidal ideation.  Patients presenting with no risk factors but with morbid ruminations; may be classified as minimal risk based on the severity of the depressive symptoms  Principal Problem: Schizophrenia [facility](HCC) Discharge Diagnoses:  Patient Active Problem List   Diagnosis Date Noted  . Essential hypertension [I10]   . Tachycardia [R00.0]   . Schizophrenia (HCC) [F20.9] 10/20/2015  . Syncope [R55] 09/18/2015    Follow-up Information    [facility]   Specialty:  Behavioral Health   Why:  Walk in apt as soon as possible. Mon-Fri 8-3pm. Arrive as early as possible.   Contact information:   [address] [place] [address] [phone]       Plan Of Care/Follow-up recommendations:  Activity:  No restrictions Diet:  regular Tests:  Follow up Primary medical doctor for monitoring your TSH as well as if your foot pain gets worse or if there is swelling Other:  Haldol decanoate 75 mg IM along with Cogentin IM q 30 days - Haldol dose reduced due to tremors - unknown if tremors are neuroleptic induced.  Is patient on multiple antipsychotic therapies at discharge:  No   Has Patient had three or more failed trials of antipsychotic monotherapy by history:  No  Recommended Plan for Multiple Antipsychotic Therapies: NA    Kc Sedlak MD 10/28/2015, 9:18 AM

## 2015-10-28 NOTE — [facility] Suicide Risk Assessment (Signed)
[facility] INPATIENT:  Family/Significant Other Suicide Prevention Education  Suicide Prevention Education:  Education Completed; No one has been identified by the patient as the family member/significant other with whom the patient will be residing, and identified as the person(s) who will aid the patient in the event of a mental health crisis (suicidal ideations/suicide attempt).  With written consent from the patient, the family member/significant other has been provided the following suicide prevention education, prior to the and/or following the discharge of the patient.  The suicide prevention education provided includes the following:  Suicide risk factors  Suicide prevention and interventions  National Suicide Hotline telephone number  [facility] assessment telephone number  [facility] Emergency Assistance 911  [facility] and/or Residential Mobile Crisis Unit telephone number  Request made of family/significant other to:  Remove weapons (e.g., guns, rifles, knives), all items previously/currently identified as safety concern.    Remove drugs/medications (over-the-counter, prescriptions, illicit drugs), all items previously/currently identified as a safety concern.  The family member/significant other verbalizes understanding of the suicide prevention education information provided.  The family member/significant other agrees to remove the items of safety concern listed above. The patient did not endorse SI at the time of admission, nor did the patient c/o SI during the stay here.  SPE not required. However, I did talk to Ms Paulino DoorObie, mother, [phone] about crises plan.  Daryel Geraldorth, Steffan Caniglia B 10/28/2015, 10:19 AM

## 2015-10-28 NOTE — Progress Notes (Signed)
1:1 Note:  Patient maintained on constant supervision for safety.  Denies visual and auditory hallucinations.  Rates depression and anxiety at 5/10.  Medication given as prescribed.  Ambulating on the unit without difficulty.  Support and encouragement offered as needed.  Complain of right great toe pain.  Cold compress applied with good effect.

## 2015-10-28 NOTE — Discharge Summary (Signed)
Physician Discharge Summary Note  Patient:  Adam English is an 49 y.o., male MRN:  [id number] DOB:  1966-08-23 Patient phone:  [phone] (home)  Patient address:   [address] [place] [address],  Total Time spent with patient: 45 minutes  Date of Admission:  10/19/2015 Date of Discharge: 10/28/2015  Reason for Admission: PER ADMISSION NOTE-Adam English is a 73 y old AAM who is single , unemployed , lives with his mother in [place] , has a hx of schizophrenia , is on Haldol decanoate LAI ( last dose - 10/13/15) presented to the ED for disorganized behavior . Pt per review of EHR had several ED visits the past few weeks. Pt was admitted to medical floor 09/17/15- 09/19/15 for polydypsia , hyponatremia . Pt was seen in ED on 10/17 for hyponatremia , and was advised to restrict his fluid intake . Pt was again seen in ED on 10/29 for disorganized behavior and was discharged to follow up with his out patient provider. Pt again was brought back to the ED 10/18/15 for disorganized behavior . Pt was admitted to [facility] , however had to be send back to the ED for chest pain , diaphoresis as well as elevated BP. Pt was seen in ED per initial notes as very confused , disoriented and agitated - pulling out IV lines and having AMS. Pt was seen by ED physician - as well as Family medicine consult team - who diagnosed patient with Acute delirium - Per consult note per Family medicine done on 10/20/15- "Likely secondary to underlying mental illness worsened by potential opioid or alcohol withdrawal. Also consider possible medication induced as patient received his Haldol DepoCyt on 10/13/2015.Marland Kitchen Patient with multiple vague complaints that seem to come and go including right arm pain, abdominal pain, headache, sweating, and arm bleeding. At any one moment patient may have one complaint which will be completely resolved a few seconds later. Ativan given in ED with significant improvement. Patient's case discussed with  Adam English at the health and then with Dr. Shela Commons. and the [facility] team [facility] at [facility]. After review of chart they agree that patient's symptoms are likely all related to his psychological disorder and have agreed to take patient back to behavioral health. "  Patient seen and chart reviewed.Discussed patient with treatment team. Writer attempted to evaluate patient. Pt laying in bed , has sitter at bedside . Pt unable to open his eyes or cooperate with writer at this time. Writer unable to assess his mental status at this time , since pt appears to be sedated. Pt seen as mumbling - unable to understand his speech.  Collateral information was obtained from mother - Adam English - at [phone] - Per mother pt was diagnosed with mental illness - schizophrenia for the very first time in 2002. Pt at that time exhibited disorganized behavior , was hoarding things like food in his closet . Pt at that time was not admitted to a mental health facility. Per mother he was first hospitalized at [facility] - in September ( which as per above was a medical admission) and the second admission is the current one. Per mother pt follows up with [facility] and he is on monthly Haldol decanoate IM 100 mg since 2013. Pt received his last IM on 10/13/15. Mother reports that pt recently started having the same spells as he had in September - when he appeared to take off his clothes and was anxious. Pt had reported to mother  one night that he had seen a masked man. Per mother pt had recent medication changes at [facility] was increased to 10 mg tid for anxiety   Principal Problem: Schizophrenia [facility]) Discharge Diagnoses: Patient Active Problem List   Diagnosis Date Noted  . Tremor of unknown origin [R25.1] 10/28/2015  . Essential hypertension [I10]   . Tachycardia [R00.0]   . Schizophrenia (HCC) [F20.9] 10/20/2015  . Syncope [R55] 09/18/2015    Musculoskeletal: Strength & Muscle Tone: within normal limits Gait & Station: normal,  shuffle Patient leans: N/A  Psychiatric Specialty Exam: SEE SRA BY MD Physical Exam  Nursing note and vitals reviewed. Constitutional: He is oriented to person, place, and time. He appears well-developed.  HENT:  Head: Normocephalic.  Neck: Normal range of motion.  Cardiovascular: Normal rate.   Musculoskeletal: Normal range of motion.  Neurological: He is alert and oriented to person, place, and time.  Skin: Skin is warm and dry.  Psychiatric: He has a normal mood and affect. His behavior is normal.    Review of Systems  Psychiatric/Behavioral: Negative for suicidal ideas. Depression: stable. Hallucinations: stable. Nervous/anxious: stable.   All other systems reviewed and are negative.   Blood pressure 127/80, pulse 83, temperature 97.9 F (36.6 C), temperature source Oral, resp. rate 17, height  (1.676 m), weight 84.823 kg (187 lb), SpO2 99 %.Body mass index is 30.2 kg/(m^2).  Have you used any form of tobacco in the last 30 days? (Cigarettes, Smokeless Tobacco, Cigars, and/or Pipes): Patient Refused Screening  Has this patient used any form of tobacco in the last 30 days? (Cigarettes, Smokeless Tobacco, Cigars, and/or Pipes) No  Past Medical History:  Past Medical History  Diagnosis Date  . Anxiety   . Panic attacks   . Depression   . Schizophrenia (HCC)    History reviewed. No pertinent past surgical history. Family History:  Family History  Problem Relation Age of Onset  . Diabetes Father    Social History:  History  Alcohol Use No     History  Drug Use No    Social History   Social History  . Marital Status: Single    Spouse Name: N/A  . Number of Children: N/A  . Years of Education: N/A   Social History Main Topics  . Smoking status: Never Smoker   . Smokeless tobacco: None  . Alcohol Use: No  . Drug Use: No  . Sexual Activity: Not Asked   Other Topics Concern  . None   Social History Narrative    Risk to Self: Is patient at risk for  suicide?: No What has been your use of drugs/alcohol within the last 12 months?: Pt denies substance abuse Risk to Others:   Prior Inpatient Therapy:   Prior Outpatient Therapy:    Level of Care:  OP  Hospital Course:  Adam English was [facility] and crisis management. He was treated discharged with the medications listed below under Medication List.  Medical problems were identified and treated as needed.  Home medications were restarted as appropriate.  Improvement was monitored by observation and Adam English daily report of symptom reduction.  Emotional and mental status was monitored by daily self-inventory reports completed by Adam English and clinical staff.         Adam English was evaluated by the treatment team for stability and plans for continued recovery upon discharge.  Adam English motivation was an integral factor for scheduling further treatment.  Employment, transportation,  bed availability, health status, family support, and any pending legal issues were also considered during his  hospital stay. He  was offered further treatment options upon discharge including but not limited to Residential, Intensive Outpatient, and Outpatient treatment.  [place] will follow up with the services as listed below under Follow Up Information.     Upon completion of this admission the patient was both mentally and medically stable for discharge denying suicidal/homicidal ideation, auditory/visual/tactile hallucinations, delusional thoughts and paranoia.  Patient was placed on 1:1 for unsteady gait (fall precaution). Seen and treated for multiple chronic health conditions. Patient was evaluated for labs, chest 2D echo and blood pressure management/stabilization.   Consults:  psychiatry  Significant Diagnostic Studies:  labs: reviewed:Urinalysis Specific Gravity 1.002, TSH: Free T4 1.14, AST 50 (H) D. Dimer Quant 0.76, Glucose 103    Discharge Vitals:   Blood pressure  127/80, pulse 83, temperature 97.9 F (36.6 C), temperature source Oral, resp. rate 17, height 5\' 6"  (1.676 m), weight 84.823 kg (187 lb), SpO2 99 %. Body mass index is 30.2 kg/(m^2). Lab Results:   No results found for this or any previous visit (from the past 72 hour(s)).  Physical Findings: AIMS: Facial and Oral Movements Muscles of Facial Expression: None, normal Lips and Perioral Area: None, normal Jaw: None, normal Tongue: None, normal,Extremity Movements Upper (arms, wrists, hands, fingers): None, normal Lower (legs, knees, ankles, toes): None, normal, Trunk Movements Neck, shoulders, hips: None, normal, Overall Severity Severity of abnormal movements (highest score from questions above): None, normal Incapacitation due to abnormal movements: None, normal Patient's awareness of abnormal movements (rate only patient's report): No Awareness, Dental Status Current problems with teeth and/or dentures?: No Does patient usually wear dentures?: No  CIWA:  CIWA-Ar Total: 0 COWS:  COWS Total Score: 16   See Psychiatric Specialty Exam and Suicide Risk Assessment completed by Attending Physician prior to discharge.  Discharge destination:  Home  Is patient on multiple antipsychotic therapies at discharge:  No   Has Patient had three or more failed trials of antipsychotic monotherapy by history:  No Recommended Plan for Multiple Antipsychotic Therapies: NA  Discharge Instructions    Activity as tolerated - No restrictions    Complete by:  As directed      Diet general    Complete by:  As directed      Discharge instructions    Complete by:  As directed   Patient has been instructed to take medications as prescribed; and report adverse effects to outpatient provider.  Follow up with primary doctor for any medical issues and If symptoms recur report to nearest emergency or crisis hot line.            Medication List    STOP taking these medications        benztropine 0.5 MG  tablet  Commonly known as:  COGENTIN     busPIRone 10 MG tablet  Commonly known as:  BUSPAR     clonazePAM 0.5 MG tablet  Commonly known as:  KLONOPIN     haloperidol decanoate 50 MG/ML injection  Commonly known as:  HALDOL DECANOATE  Replaced by:  haloperidol decanoate 100 MG/ML injection     hydrOXYzine 25 MG tablet  Commonly known as:  ATARAX/VISTARIL      TAKE these medications      Indication   cloNIDine 0.1 MG tablet  Commonly known as:  CATAPRES  Take 1 tablet (0.1 mg total) by mouth 2 (two) times daily.  Indication:  HNT     cyanocobalamin 500 MCG tablet  Take 1 tablet (500 mcg total) by mouth daily.      feeding supplement (ENSURE ENLIVE) Liqd  Take 237 mLs by mouth 3 (three) times daily between meals.      haloperidol decanoate 100 MG/ML injection  Commonly known as:  HALDOL DECANOATE  Inject 0.75 mLs (75 mg total) into the muscle every 30 (thirty) days. To be given at office on 11/13/2015  Start taking on:  11/13/2015   Indication:  mood stabilization     metoprolol tartrate 25 MG tablet  Commonly known as:  LOPRESSOR  Take 1 tablet (25 mg total) by mouth 2 (two) times daily.   Indication:  HTN     nystatin 100000 UNIT/ML suspension  Commonly known as:  MYCOSTATIN  Take 5 mLs (500,000 Units total) by mouth 4 (four) times daily.      sertraline 25 MG tablet  Commonly known as:  ZOLOFT  Take 1 tablet (25 mg total) by mouth daily.   Indication:  mood stabliization     traZODone 100 MG tablet  Commonly known as:  DESYREL  Take 1 tablet (100 mg total) by mouth at bedtime.   Indication:  Trouble Sleeping     trihexyphenidyl 2 MG tablet  Commonly known as:  ARTANE  Take 1 tablet (2 mg total) by mouth 2 (two) times daily with a meal.   Indication:  Extrapyramidal Reaction caused by Medications           Follow-up Information    Follow [facility] with [facility] Go on 11/10/2015.   Specialty:  Behavioral Health   Why:  Wednesday at 1:20 for your next  injection.  This will be for 75MG  instead of the 100MG  you were previously getting.   Contact information:   [address] [place] [address] [phone]       Follow up with Primary Care Provider or [facility] . Schedule an appointment as soon as possible for a visit in 2 weeks.   Why:  Toe Pain and Labs: TSH   Contact information:   [address] [place] [address] [phone]      Follow-up recommendations:  Activity:  as tolerated Diet:  heart healthy Other:  f/u with PCP for Labs TSH, Dimer, and Glucose  Comments: Take all of you medications as prescribed by your mental healthcare provider.  Report any adverse effects and reactions from your medications to your outpatient provider promptly. Do not engage in alcohol and or illegal drug use while on prescription medicines. In the event of worsening symptoms call the crisis hotline, 911, and or go to the nearest emergency department for appropriate evaluation and treatment of symptoms. Follow-up with your primary care provider for your medical issues, concerns and or health care needs.   Keep all scheduled appointments.  If you are unable to keep an appointment call to reschedule.  Let the nurse know if you will need medications before next scheduled appointment.  Total Discharge Time: Greater than 30 minutes  Signed: Oneta Rack FNP-BC 10/28/2015, 4:42 PM

## 2015-10-28 NOTE — Progress Notes (Signed)
Pt discharged home with samples and prescriptions. Pt was stable and appreciative at that time. All papers and prescriptions were given and valuables returned. Verbal understanding expressed. Denies SI/HI and A/VH. Pt given opportunity to express concerns and ask questions.

## 2015-10-28 NOTE — Tx Team (Signed)
Interdisciplinary Treatment Plan Update (Adult)  Date:  10/28/2015 Time Reviewed:  9:47 AM  Progress in Treatment: Attending groups: Pt new to milieu, still assessing. Participating in groups: NA. Taking medication as prescribed:  Yes. Tolerating medication:  Yes. Family/Significant other contact made:  Yes  Patient understands diagnosis:  Yes, as evidenced by seeking help with AVH. Discussing patient identified problems/goals with staff:  Yes, see initial care plan. Medical problems stabilized or resolved:  Yes Denies suicidal/homicidal ideation: Yes. Issues/concerns per patient self-inventory: No. Other:  New problem(s) identified:   Discharge Plan or Barriers: See below  Reason for Continuation of Hospitalization:   Comments: Adam English is a 37 y old AAM who is single , unemployed , lives with his mother in [place] , has a hx of schizophrenia , is on Haldol decanoate LAI ( last dose - 10/13/15) presented to the ED for disorganized behavior . Pt per review of EHR had several ED visits the past few weeks. Pt was admitted to medical floor 09/17/15- 09/19/15 for polydypsia , hyponatremia . Pt was seen in ED on 10/17 for hyponatremia , and was advised to restrict his fluid intake . Pt was again seen in ED on 10/29 for disorganized behavior and was discharged to follow up with his out patient provider. Pt again was brought back to the ED 10/18/15 for disorganized behavior . Pt was admitted to [facility] , however had to be send back to the ED for chest pain , diaphoresis as well as elevated BP. Pt was seen in ED per initial notes as very confused , disoriented and agitated - pulling out IV lines and having AMS. Pt was seen by ED physician - as well as Family medicine consult team - who diagnosed patient with Acute delirium likely 2/2 his recent Haldol decanoate IM. Pt refused injection prior to admission.   Haldol, Ativan, Zoloft trial  10/25/15:  Adam English has been in his room much of the morning. 1:1  sitter by side. He was able to get up and take a shower. He reports that he doesn't feel as steady as he did yesterday. He feels that the shaking is worse and noted that he has been having trouble with his eyes being sensitive to light. He denies any SI/HI or A/V hallucinations.Poor sleep last night.   No longer getting haldol.  Continue with Zoloft, Ativan PRN, Trazodone for sleep  Estimated length of stay: D/C today New goal(s):  Review of initial/current patient goals per problem list:  1. Goal(s): Patient will participate in aftercare plan  Met:Yes  Target date: at discharge  As evidenced by: Patient will participate within aftercare plan AEB aftercare provider and housing plan at discharge being identified.  10/20/15: Return home, follow up [facility]  2. Goal (s): Patient will exhibit decreased depressive symptoms and suicidal ideations.  Met:Yes   Target date: at discharge  As evidenced by: Patient will utilize self rating of depression at 3 or below and demonstrate decreased signs of depression or be deemed stable for discharge by MD. 10/20/15: Pt endorses SI and has attempted to self-harm by running into objects on the hallway. CSW will continue to assess. 10/25/15:  Pt denies SI and depression today.    4. Goal(s): Patient will demonstrate decreased signs of psychosis.  Met: Yes  Target date:at discharge  As evidenced by: Patient will demonstrate decreased signs of psychosis as evidenced by a reduction in AVH, paranoia, and/or delusions.   10/20/15: Pt endorses AVH. Pt states that he hears  voices that tell him he's going to die. 10/25/15:  Pt denies AVH.  5. Goal(s): Patient will demonstrate decreased signs of withdrawal due to substance abuse   Met: Yes   Target date: 3-5 days post admission date   As evidenced by: Patient will produce a CIWA/COWS score of 0, have stable vitals signs, and no symptoms of withdrawal 10/20/15: Pt currently has a  CIWA score of 36. 10/25/15:  CIWA score of 10 with increased BP and Pulse No signs nor symptoms of withdrawal today.  Goal met.  Alfonso Patten [facility] LCSW 10/28/2015 9:48 AM      Attendees: Patient:  10/28/2015 9:47 AM  Family:   10/28/2015 9:47 AM  Physician:  Dr. Ursula Alert, MD 10/28/2015 9:47 AM  Nursing: Hedy Jacob, RN   10/28/2015 9:47 AM  Case Manager:  Roque Lias, LCSW 10/28/2015 9:47 AM  Counselor:  Matthew Saras, MSW Intern 10/28/2015 9:47 AM  Other:   10/28/2015 9:47 AM  Other:   10/28/2015 9:47 AM  Other:   10/28/2015 9:47 AM  Other:  10/28/2015 9:47 AM  Other:    Other:    Other:    Other:    Other:    Other:      Scribe for Treatment Team:   Georga Kaufmann, MSW Intern 10/28/2015 9:47 AM

## 2015-10-28 NOTE — Plan of Care (Signed)
Problem: Ineffective individual coping Goal: STG: Patient will remain free from self harm Outcome: Progressing Client is safe on the unit AEB 1:1 monitoring, takes medication as ordered.

## 2016-04-10 IMAGING — CT CT ANGIO CHEST
2 of 6 series · 18 of 36 positions shown · IV contrast (OMNIPAQUE 350)
Comparison: None.

CLINICAL DATA: History of schizophrenia. Hypertension and sinus
tachycardia. Evaluate for pulmonary embolus.

EXAM:
CT ANGIOGRAPHY CHEST WITH CONTRAST
TECHNIQUE: Multidetector CT imaging of the chest was performed using the
standard protocol during bolus administration of intravenous
contrast. Multiplanar CT image reconstructions and MIPs were
obtained to evaluate the vascular anatomy.
CONTRAST:  100mL OMNIPAQUE IOHEXOL 350 MG/ML SOLN

[Series 5: coronal mpr · coronal · 0.46mm/px · 1 of 151 slices shown]
[im 76/151  mediastinal]
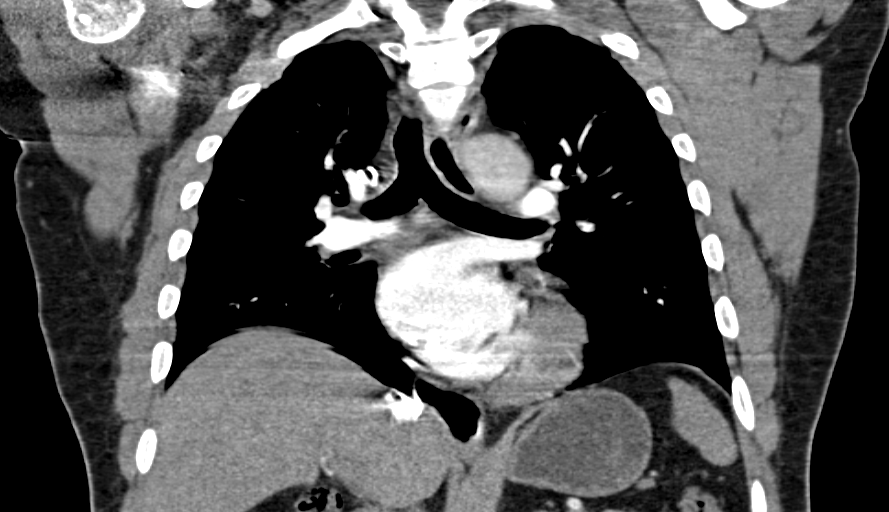

[Series 10: thins for pacs · axial · 0.74mm/px · z∈[-111,+102]mm · 17 of 237 slices shown]
[im 12/237  lung]
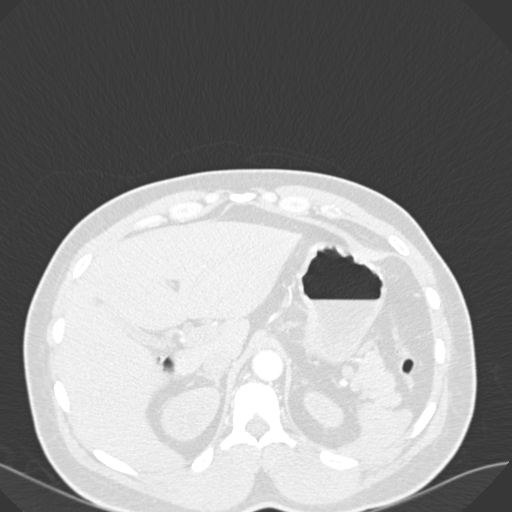
[im 24/237  mediastinal]
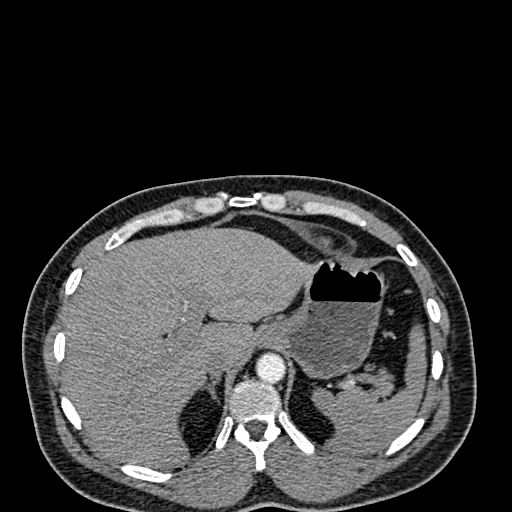
[im 36/237  lung]
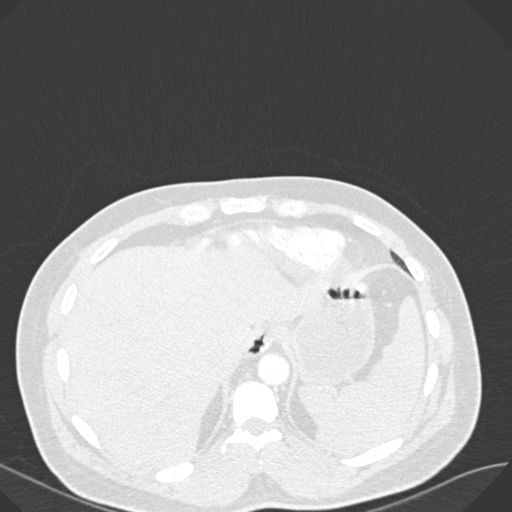
[im 48/237  mediastinal]
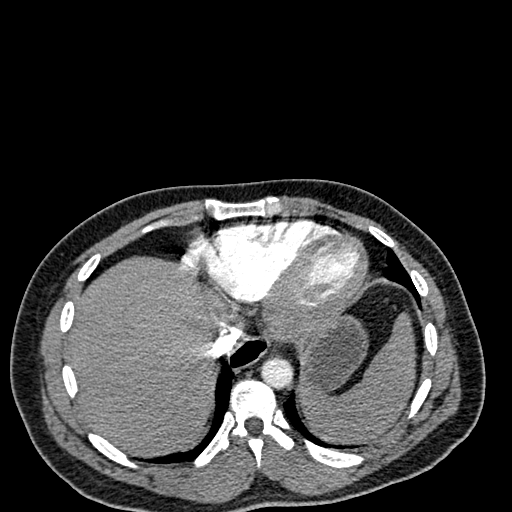
[im 71/237  lung]
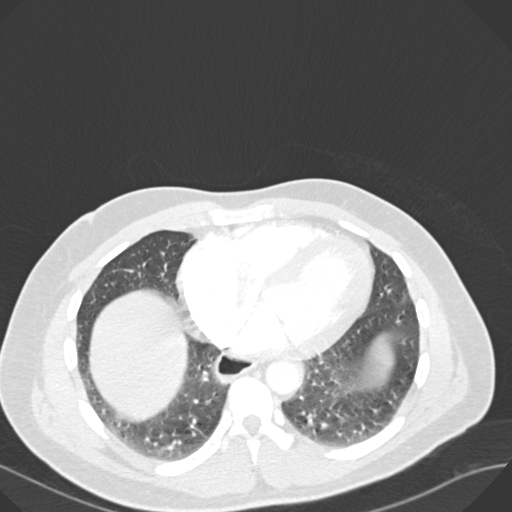
[im 83/237  mediastinal]
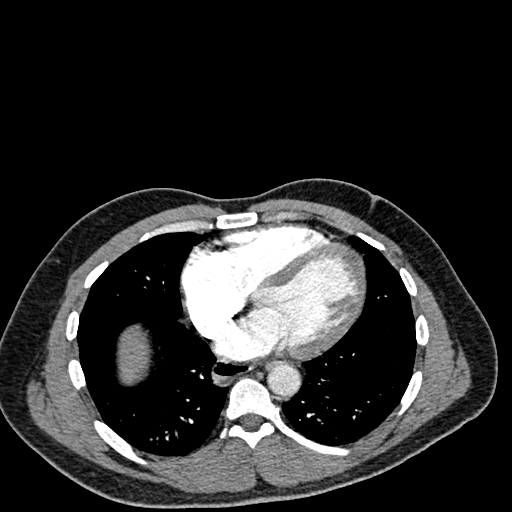
[im 95/237  lung]
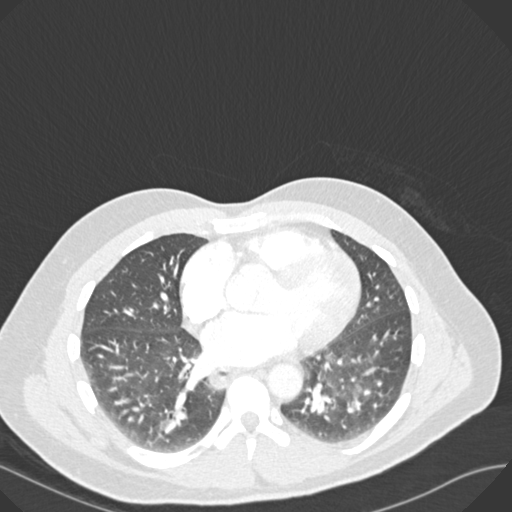
[im 107/237  mediastinal]
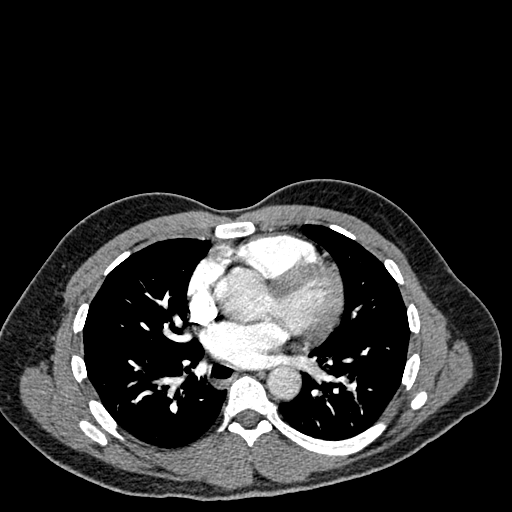
[im 119/237  lung]
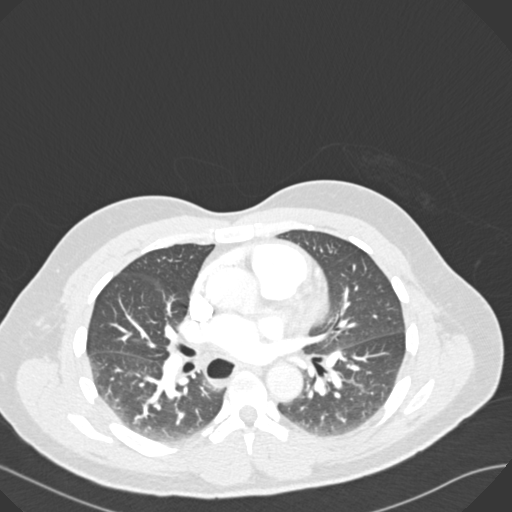
[im 130/237  mediastinal]
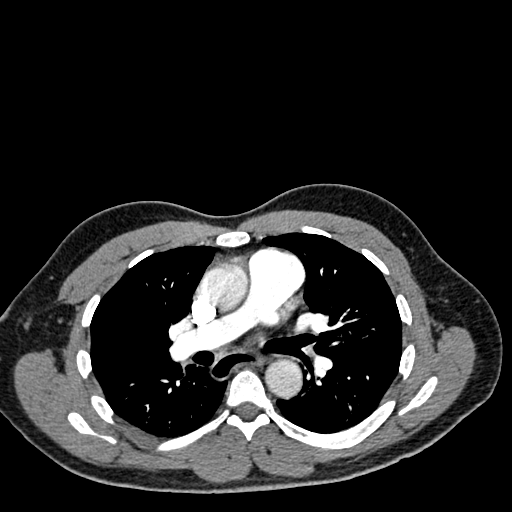
[im 142/237  lung]
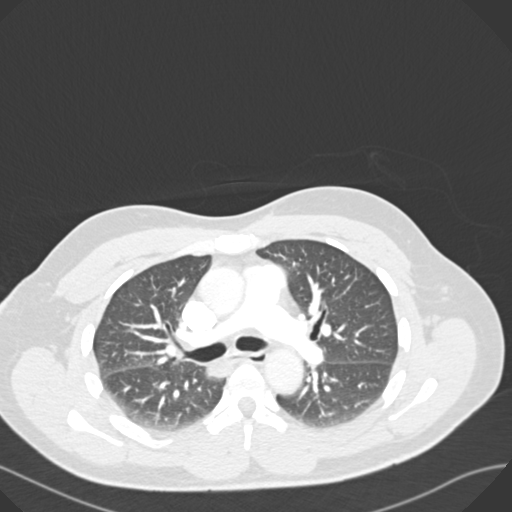
[im 154/237  mediastinal]
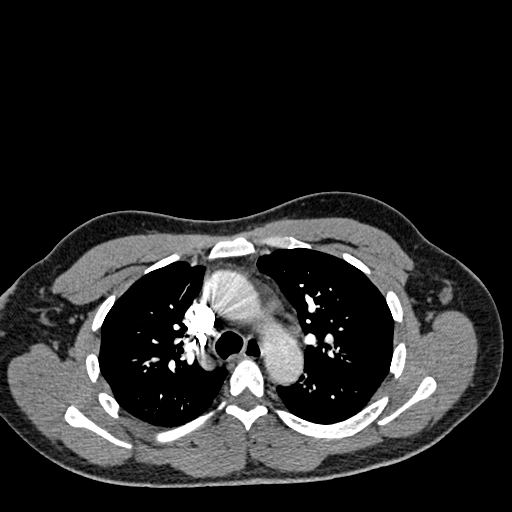
[im 166/237  lung]
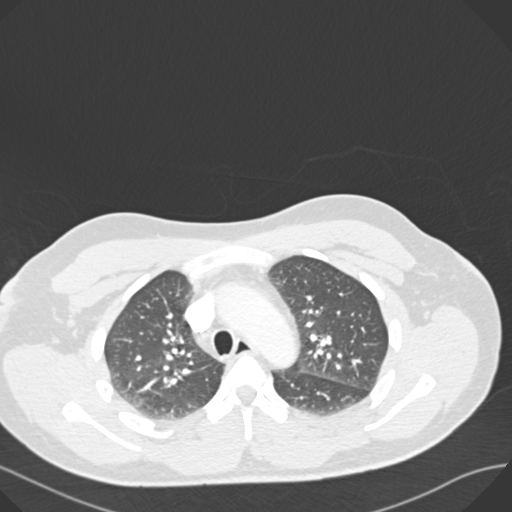
[im 189/237  mediastinal]
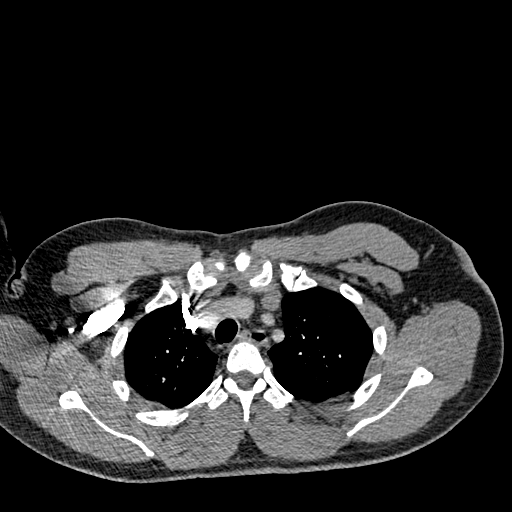
[im 201/237  lung]
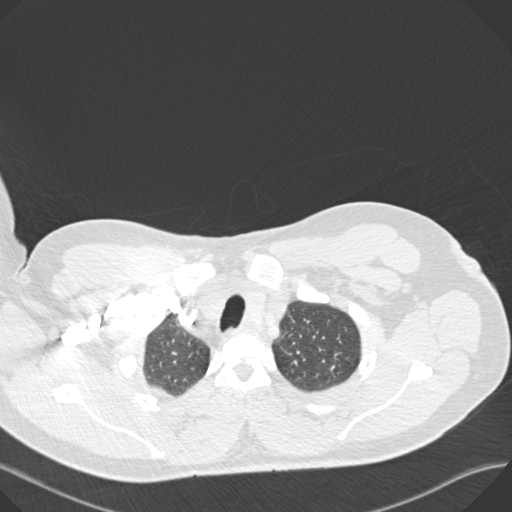
[im 213/237  mediastinal]
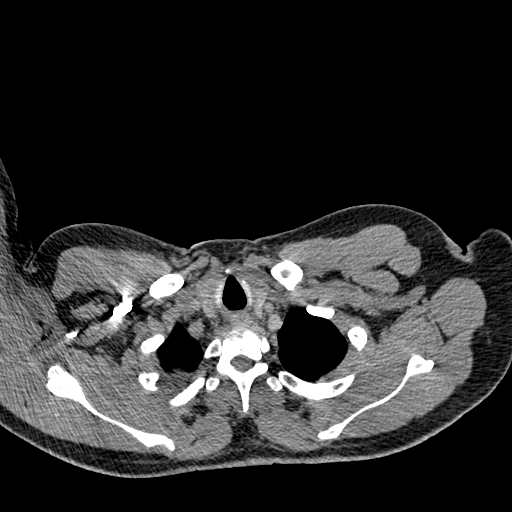
[im 225/237  lung]
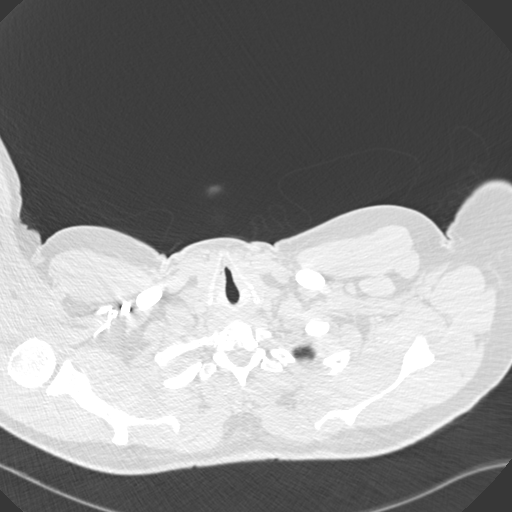

[18 of 36 positions shown; findings below may reference images not displayed]

FINDINGS: Mediastinum: The heart size appears normal. There is no pericardial
effusion. Moderate distension of the patulous esophagus. No mass.
Trachea is patent and appears midline. The main pulmonary artery
appears normal. No saddle embolus. No lobar or segmental pulmonary
artery filling defects identified.

Lungs/Pleura: No airspace consolidation or atelectasis. No
suspicious pulmonary nodule or mass identified.

Upper Abdomen: The visualized portions of the liver are normal. The
adrenal glands are unremarkable. Normal appearance of the visualized
portions of the spleen.

Musculoskeletal: No aggressive lytic or sclerotic bone lesion.

Review of the MIP images confirms the above findings.
IMPRESSION: 1. Examination is negative for acute pulmonary embolus.
2. Patulous esophagus.  No mass identified.

## 2016-04-10 IMAGING — CR DG FOOT COMPLETE 3+V*L*
3 series · 3 of 3 positions shown · non-contrast
Comparison: None.

CLINICAL DATA: 49-year-old male with a history of left foot pain

EXAM:
LEFT FOOT - COMPLETE 3+ VIEW

[x foot ap left]
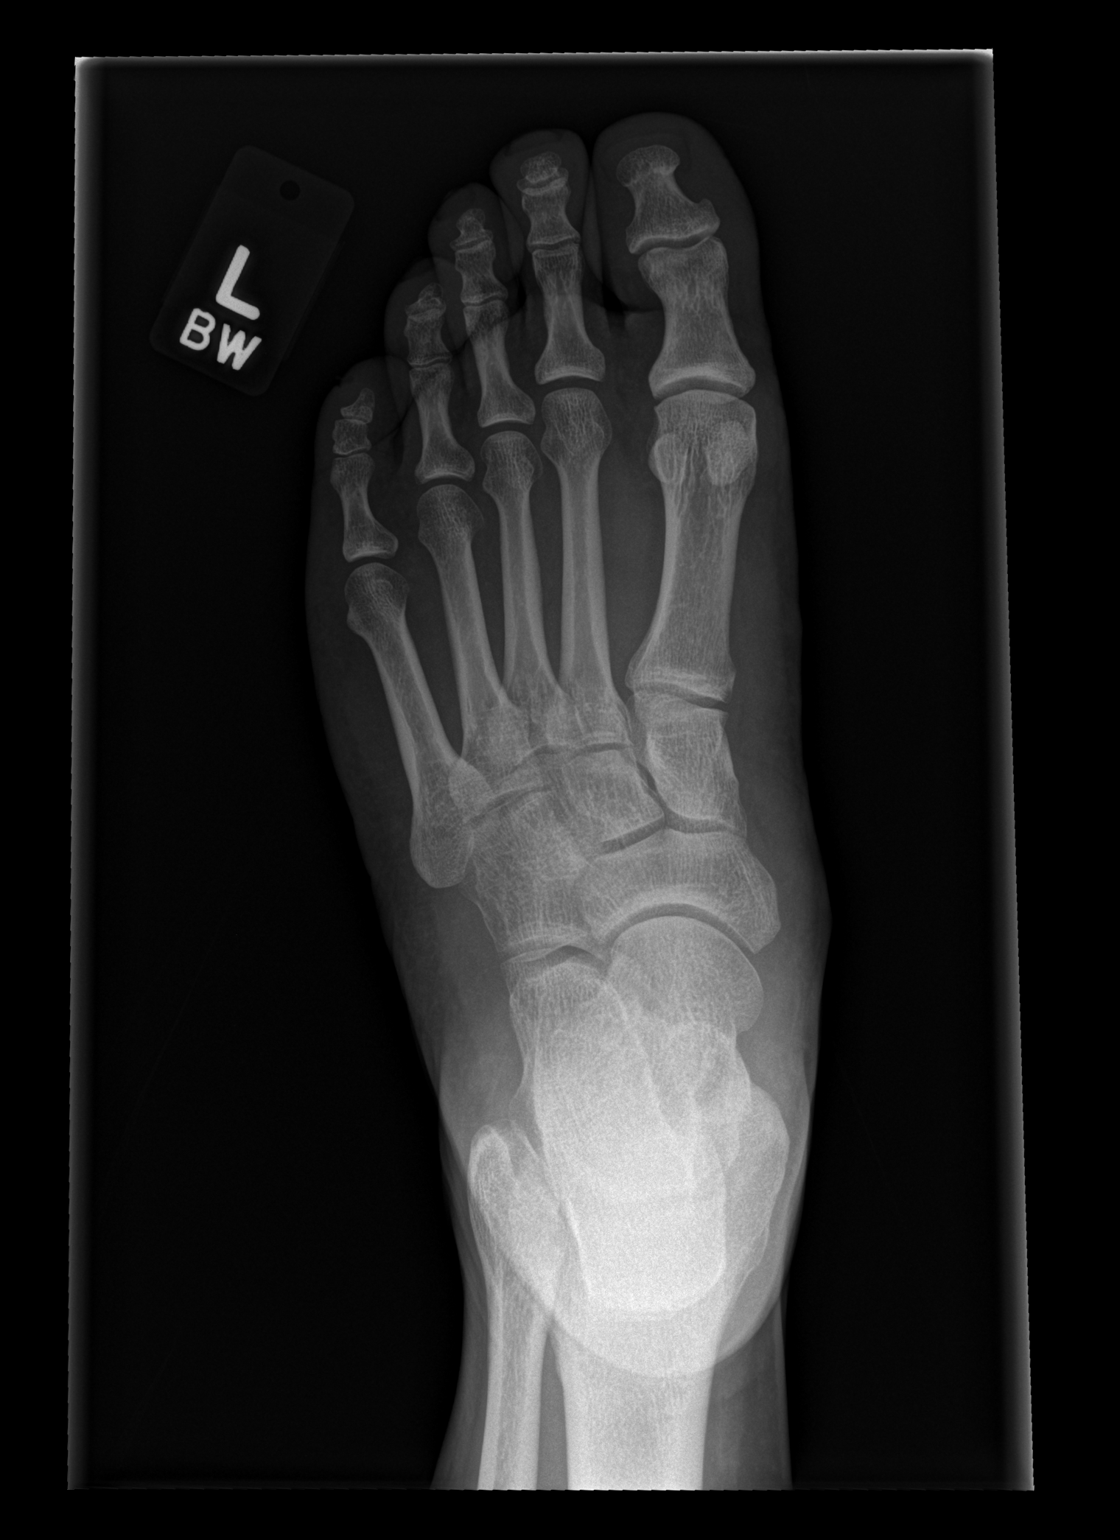

[x foot obl left]
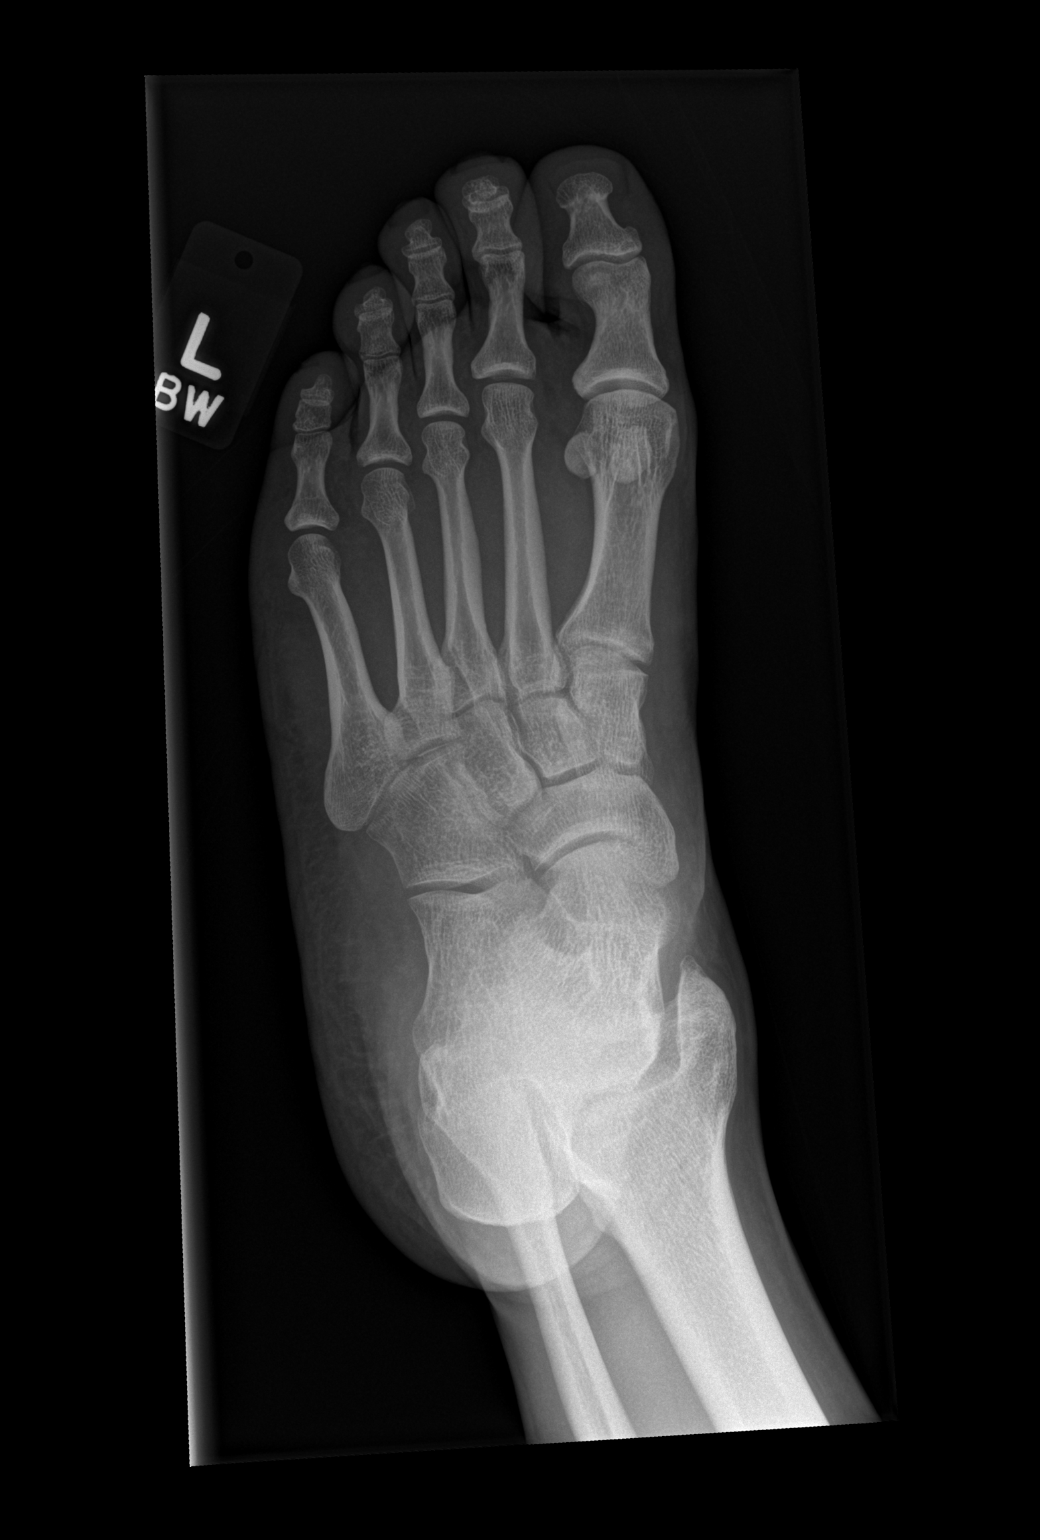

[x foot lat left]
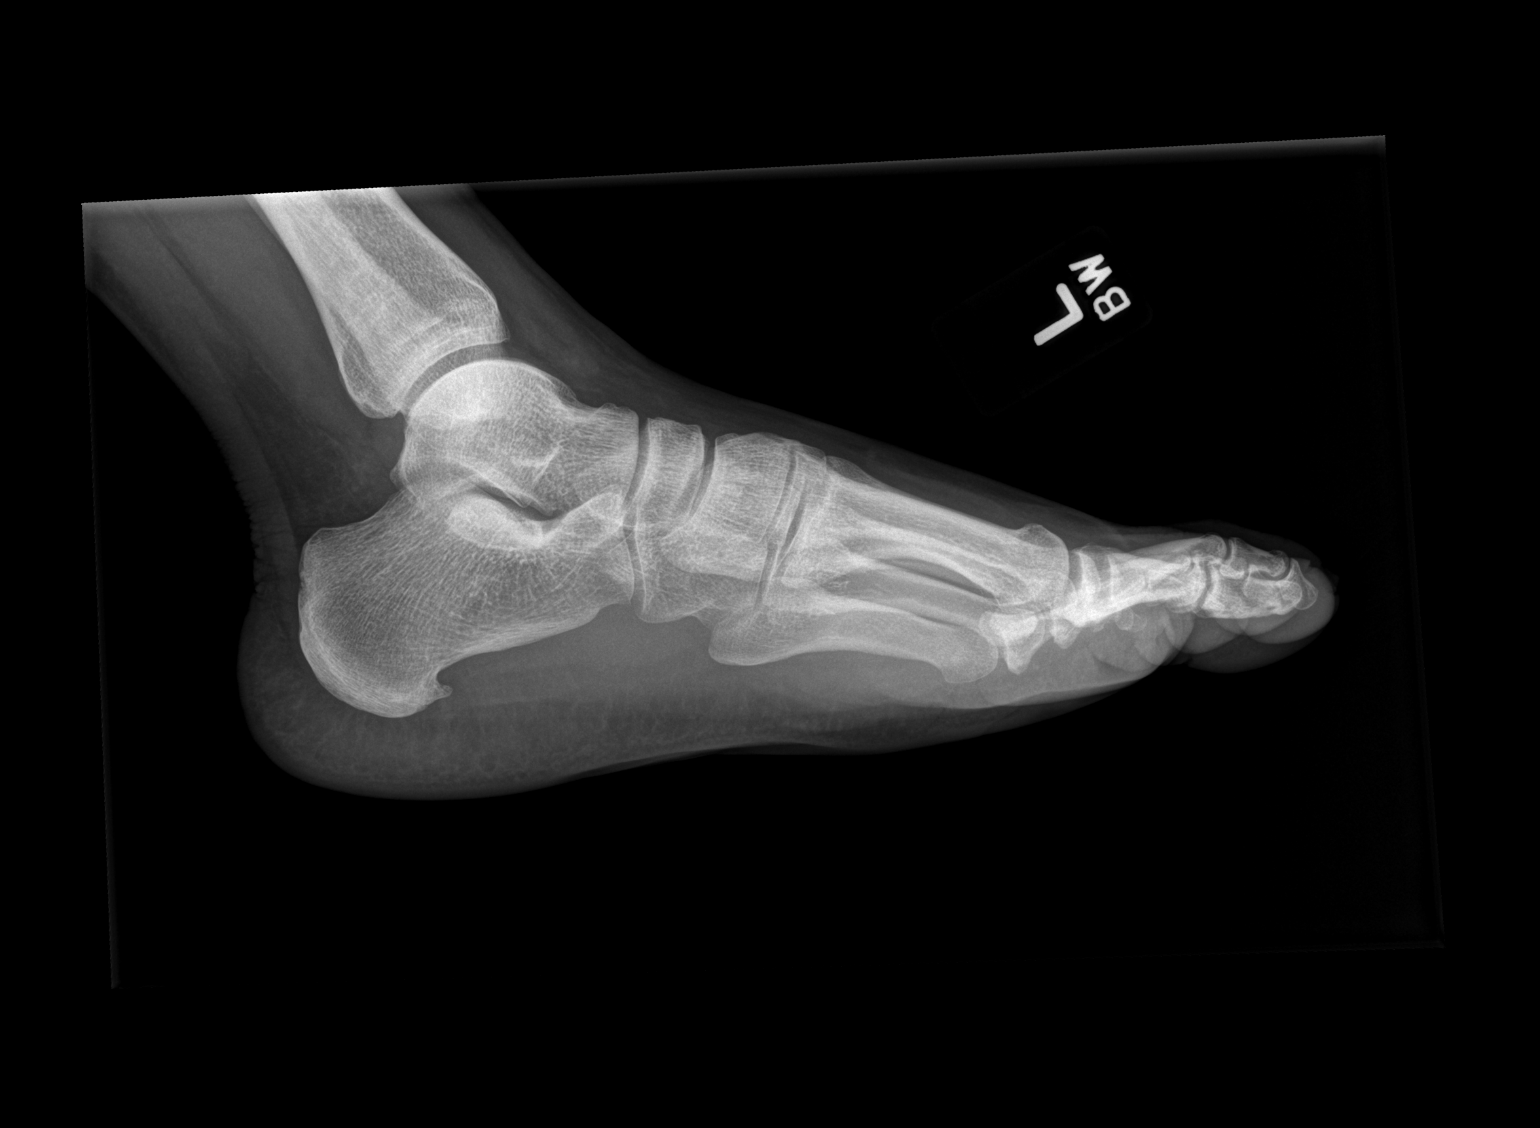

[3 of 3 positions shown; findings below may reference images not displayed]

FINDINGS: No acute fracture. No significant soft tissue swelling. No
radiopaque foreign body. Mild changes of osteoarthritis of the
interphalangeal joints. Mild degenerative changes of the midfoot and
hindfoot.
IMPRESSION: Negative for acute bony abnormality.
